# Patient Record
Sex: Male | Born: 1970 | Race: Black or African American | Hispanic: No | Marital: Single | Smoking: Current every day smoker
Health system: Southern US, Community
[De-identification: ages and names within clinical notes are randomized; demographics above are authoritative.]

## PROBLEM LIST (undated history)

## (undated) DIAGNOSIS — J45909 Unspecified asthma, uncomplicated: Secondary | ICD-10-CM

## (undated) DIAGNOSIS — F17200 Nicotine dependence, unspecified, uncomplicated: Secondary | ICD-10-CM

## (undated) DIAGNOSIS — E785 Hyperlipidemia, unspecified: Secondary | ICD-10-CM

## (undated) HISTORY — PX: TONSILLECTOMY: SUR1361

## (undated) HISTORY — DX: Unspecified asthma, uncomplicated: J45.909

## (undated) HISTORY — DX: Hyperlipidemia, unspecified: E78.5

## (undated) HISTORY — DX: Nicotine dependence, unspecified, uncomplicated: F17.200

## (undated) HISTORY — PX: WISDOM TOOTH EXTRACTION: SHX21

---

## 2003-10-31 ENCOUNTER — Emergency Department (HOSPITAL_COMMUNITY): Admission: EM | Admit: 2003-10-31 | Discharge: 2003-10-31 | Payer: Self-pay | Admitting: Emergency Medicine

## 2003-11-01 ENCOUNTER — Ambulatory Visit (HOSPITAL_COMMUNITY): Admission: RE | Admit: 2003-11-01 | Discharge: 2003-11-01 | Payer: Self-pay | Admitting: Emergency Medicine

## 2012-11-26 ENCOUNTER — Telehealth: Payer: Self-pay | Admitting: Family Medicine

## 2012-11-26 NOTE — Telephone Encounter (Signed)
This person would like to be your patient.  His girlfriend is a patient of yours, Janalyn Harder.  Pt has [facility]. Will you accept?

## 2012-11-27 NOTE — Telephone Encounter (Signed)
ok 

## 2012-11-27 NOTE — Telephone Encounter (Signed)
appt set/kh 

## 2013-02-16 ENCOUNTER — Ambulatory Visit: Payer: Self-pay | Admitting: Family Medicine

## 2013-04-14 ENCOUNTER — Ambulatory Visit: Payer: Self-pay | Admitting: Family Medicine

## 2013-05-11 ENCOUNTER — Ambulatory Visit (INDEPENDENT_AMBULATORY_CARE_PROVIDER_SITE_OTHER): Payer: BC Managed Care – PPO | Admitting: Family Medicine

## 2013-05-11 ENCOUNTER — Encounter: Payer: Self-pay | Admitting: Family Medicine

## 2013-05-11 VITALS — BP 110/80 | Temp 98.6°F | Ht 68.7 in | Wt 151.0 lb

## 2013-05-11 DIAGNOSIS — F17200 Nicotine dependence, unspecified, uncomplicated: Secondary | ICD-10-CM

## 2013-05-11 DIAGNOSIS — Z8042 Family history of malignant neoplasm of prostate: Secondary | ICD-10-CM | POA: Insufficient documentation

## 2013-05-11 DIAGNOSIS — Z720 Tobacco use: Secondary | ICD-10-CM | POA: Insufficient documentation

## 2013-05-11 DIAGNOSIS — Z0000 Encounter for general adult medical examination without abnormal findings: Secondary | ICD-10-CM | POA: Insufficient documentation

## 2013-05-11 LAB — POCT URINALYSIS DIPSTICK
Bilirubin, UA: NEGATIVE
Ketones, UA: NEGATIVE
Leukocytes, UA: NEGATIVE
Nitrite, UA: NEGATIVE
Protein, UA: NEGATIVE
pH, UA: 7

## 2013-05-11 MED ORDER — VARENICLINE TARTRATE 1 MG PO TABS: 1.0000 mg | ORAL_TABLET | Freq: Two times a day (BID) | ORAL | Status: DC

## 2013-05-11 NOTE — Patient Instructions (Signed)
Labs today  Chantix 1 mg.......... one half tablet daily for 2 weeks then one half tab twice daily  Taper by 2 cigarettes per week  Return in one month for followup

## 2013-05-11 NOTE — Progress Notes (Signed)
  Subjective:    Patient ID: Eugene Kennedy, male    DOB: [birth year], 42 y.o.   MRN: [id number]  HPI Eugene Kennedy is a 42 year old male smoker 10 cigarettes a day for about 20 years who comes in today as a new patient  He's always been in excellent health he said no chronic health problems and he takes no medication for any chronic disease. We is a child his mother told him he had a rash from penicillin.  As noted above he does smoke about 10 cigarettes a day for 20 years he expresses a desire to stop.  Family history pertinent father was an alcoholic and had prostate cancer.  He works for a Location manager   Review of Systems  Constitutional: Negative.   HENT: Negative.   Eyes: Negative.   Respiratory: Negative.   Cardiovascular: Negative.   Gastrointestinal: Negative.   Genitourinary: Negative.   Musculoskeletal: Negative.   Skin: Negative.   Neurological: Negative.   Psychiatric/Behavioral: Negative.        Objective:   Physical Exam  Constitutional: He is oriented to person, place, and time. He appears well-developed and well-nourished.  HENT:  Head: Normocephalic and atraumatic.  Right Ear: External ear normal.  Left Ear: External ear normal.  Nose: Nose normal.  Mouth/Throat: Oropharynx is clear and moist.  Eyes: Conjunctivae and EOM are normal. Pupils are equal, round, and reactive to light.  Neck: Normal range of motion. Neck supple. No JVD present. No tracheal deviation present. No thyromegaly present.  Cardiovascular: Normal rate, regular rhythm, normal heart sounds and intact distal pulses.  Exam reveals no gallop and no friction rub.   No murmur heard. Pulmonary/Chest: Effort normal and breath sounds normal. No stridor. No respiratory distress. He has no wheezes. He has no rales. He exhibits no tenderness.  Abdominal: Soft. Bowel sounds are normal. He exhibits no distension and no mass. There is no tenderness. There is no rebound and no guarding.  Genitourinary:  Rectum normal, prostate normal and penis normal. Guaiac negative stool. No penile tenderness.  Musculoskeletal: Normal range of motion. He exhibits no edema and no tenderness.  Lymphadenopathy:    He has no cervical adenopathy.  Neurological: He is alert and oriented to person, place, and time. He has normal reflexes. No cranial nerve deficit. He exhibits normal muscle tone.  Skin: Skin is warm and dry. No rash noted. No erythema. No pallor.  Psychiatric: He has a normal mood and affect. His behavior is normal. Judgment and thought content normal.          Assessment & Plan:  Healthy male  Family history prostate cancer check PSA and DRE yearly  Tobacco abuse begin smoking cessation program

## 2013-05-12 LAB — BASIC METABOLIC PANEL
CO2: 27 mEq/L (ref 19–32)
Calcium: 9.6 mg/dL (ref 8.4–10.5)
Chloride: 104 mEq/L (ref 96–112)
Creatinine, Ser: 1.1 mg/dL (ref 0.4–1.5)
Sodium: 137 mEq/L (ref 135–145)

## 2013-05-12 LAB — CBC WITH DIFFERENTIAL/PLATELET
Eosinophils Relative: 4.2 % (ref 0.0–5.0)
HCT: 40.8 % (ref 39.0–52.0)
Lymphs Abs: 2.8 10*3/uL (ref 0.7–4.0)
MCHC: 34 g/dL (ref 30.0–36.0)
MCV: 90.9 fl (ref 78.0–100.0)
Monocytes Absolute: 0.4 10*3/uL (ref 0.1–1.0)
Neutrophils Relative %: 44.7 % (ref 43.0–77.0)
Platelets: 235 10*3/uL (ref 150.0–400.0)
RDW: 14.5 % (ref 11.5–14.6)
WBC: 6.3 10*3/uL (ref 4.5–10.5)

## 2013-05-12 LAB — HEPATIC FUNCTION PANEL
Albumin: 4.5 g/dL (ref 3.5–5.2)
Alkaline Phosphatase: 62 U/L (ref 39–117)
Bilirubin, Direct: 0.1 mg/dL (ref 0.0–0.3)
Total Bilirubin: 0.8 mg/dL (ref 0.3–1.2)

## 2013-05-12 LAB — LIPID PANEL
Cholesterol: 171 mg/dL (ref 0–200)
Total CHOL/HDL Ratio: 3
Triglycerides: 153 mg/dL — ABNORMAL HIGH (ref 0.0–149.0)

## 2013-05-12 LAB — PSA: PSA: 1.13 ng/mL (ref 0.10–4.00)

## 2013-06-18 ENCOUNTER — Ambulatory Visit: Payer: BC Managed Care – PPO | Admitting: Family Medicine

## 2013-11-19 ENCOUNTER — Ambulatory Visit: Payer: BC Managed Care – PPO | Admitting: Family Medicine

## 2016-05-29 ENCOUNTER — Ambulatory Visit (INDEPENDENT_AMBULATORY_CARE_PROVIDER_SITE_OTHER): Payer: 59 | Admitting: Family Medicine

## 2016-05-29 ENCOUNTER — Encounter: Payer: Self-pay | Admitting: Family Medicine

## 2016-05-29 VITALS — BP 120/82 | HR 97 | Temp 98.6°F | Wt 166.0 lb

## 2016-05-29 DIAGNOSIS — R229 Localized swelling, mass and lump, unspecified: Secondary | ICD-10-CM

## 2016-05-29 NOTE — Progress Notes (Signed)
Pre visit review using our clinic review tool, if applicable. No additional management support is needed unless otherwise documented below in the visit note. 

## 2016-05-29 NOTE — Progress Notes (Signed)
Phone: [phone]  Subjective:  Patient presents today to establish care. Chief complaint-noted.   See problem oriented charting  The following were reviewed and entered/updated in epic: Past Medical History:  Diagnosis Date  . Asthma    childhood  . Smoker    Patient Active Problem List   Diagnosis Date Noted  . Tobacco abuse 05/11/2013    Priority: High  . Family history of prostate cancer 05/11/2013    Priority: Low   Past Surgical History:  Procedure Laterality Date  . WISDOM TOOTH EXTRACTION     x1    Family History  Problem Relation Age of Onset  . Prostate cancer Father     age 73. now 27 as of 2017  . Hypertension Father   . Alcohol abuse Father     did other drugs as well  . Healthy Mother   . HIV Brother     died of aids- half brother. 2 half sisters.   . Healthy Daughter     45 years old    Medications- reviewed and updated No current outpatient prescriptions on file.   No current facility-administered medications for this visit.     Allergies-reviewed and updated Allergies  Allergen Reactions  . Penicillins     Social History   Social History  . Marital status: Single    Spouse name: N/A  . Number of children: N/A  . Years of education: N/A   Social History Main Topics  . Smoking status: Current Every Day Smoker    Packs/day: 0.50    Types: Cigarettes    Start date: 07/16/1990  . Smokeless tobacco: None  . Alcohol use Yes     Comment: 5 drinks a week  . Drug use: No  . Sexual activity: Not Asked   Other Topics Concern  . None   Social History Narrative   Single. 1 daughter age 45, lives in [place]- lives in 2017.       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       Hobbies: music    ROS--Full ROS was completed Review of Systems  Constitutional: Negative for chills and fever.  HENT: Negative for hearing loss and tinnitus.    Eyes: Negative for blurred vision and double vision.  Respiratory: Negative for cough and hemoptysis.   Cardiovascular: Negative for chest pain and palpitations.  Gastrointestinal: Negative for heartburn and nausea.  Genitourinary: Negative for dysuria and urgency.  Musculoskeletal: Negative for myalgias and neck pain.  Skin: Negative for itching and rash.  Neurological: Negative for dizziness and headaches.  Endo/Heme/Allergies: Negative for polydipsia. Does not bruise/bleed easily.    Objective: BP 120/82 (BP Location: Left Arm, Patient Position: Sitting, Cuff Size: Large)   Pulse 97   Temp 98.6 F (37 C) (Oral)   Wt 166 lb (75.3 kg)   SpO2 99%   BMI 24.73 kg/m  Gen: NAD, resting comfortably HEENT: Mucous membranes are moist. Oropharynx normal. TM normal. Eyes: sclera and lids normal, PERRLA Neck: no cervical lymphadenopathy CV: RRR no murmurs rubs or gallops Lungs: CTAB no crackles, wheeze, rhonchi Abdomen: soft/nontender/nondistended/normal bowel sounds. No rebound or guarding.  Ext: no edema Skin: warm, dry On left occiput- has a < 1 x 1 cm mobile subcutaneous nodule/potential cyst Neuro: 5/5 strength in upper and lower extremities, normal gait, normal reflexes  Assessment/Plan:  Subcutaneous nodule S: felt like a  knot or ingrown hair in back of left scalp. Growing slightly and has become more painful. When lays on it- some pain when lays on scalp. Sometimes in bed can make it hard to sleep or even wake up. Present for over a month perhaps up to 6 weeks.   Somewhat similar lesion on his thumb though that is near joint space and may be ganglion cyst- present over a year and painful with palpation.  A/P: This may be a cyst but with pain and texture- I also wonder about isolated neurofibroma. Patient would like to have removed and discussed thought this would be best done by dermatology so referral provided   Orders Placed This Encounter  Procedures  . Ambulatory  referral to Dermatology    Referral Priority:   Routine    Referral Type:   Consultation    Referral Reason:   Specialty Services Required    Requested Specialty:   Dermatology    Number of Visits Requested:   1   Return precautions advised.  Garret Reddish, MD

## 2016-05-29 NOTE — Patient Instructions (Signed)
We will call you within a week about your referral to dermatology. If you do not hear within 2 weeks, give [place] a call.   I may actually see you before you have the dermatology visit but when you do go in- want you to get your hair cut as short as possible which will assist with the removal. Shaving would be the most ideal.

## 2016-07-02 ENCOUNTER — Ambulatory Visit (INDEPENDENT_AMBULATORY_CARE_PROVIDER_SITE_OTHER): Payer: 59 | Admitting: Family Medicine

## 2016-07-02 ENCOUNTER — Encounter: Payer: Self-pay | Admitting: Family Medicine

## 2016-07-02 ENCOUNTER — Other Ambulatory Visit (HOSPITAL_COMMUNITY)
Admission: RE | Admit: 2016-07-02 | Discharge: 2016-07-02 | Disposition: A | Discharge status: Home / Self Care | Payer: 59 | Source: Ambulatory Visit | Attending: Family Medicine | Admitting: Family Medicine

## 2016-07-02 VITALS — BP 126/82 | HR 89 | Temp 98.2°F | Ht 68.0 in | Wt 162.0 lb

## 2016-07-02 DIAGNOSIS — Z113 Encounter for screening for infections with a predominantly sexual mode of transmission: Secondary | ICD-10-CM | POA: Insufficient documentation

## 2016-07-02 DIAGNOSIS — Z7251 High risk heterosexual behavior: Secondary | ICD-10-CM | POA: Diagnosis not present

## 2016-07-02 DIAGNOSIS — Z0000 Encounter for general adult medical examination without abnormal findings: Secondary | ICD-10-CM | POA: Diagnosis not present

## 2016-07-02 LAB — COMPREHENSIVE METABOLIC PANEL
ALBUMIN: 4.8 g/dL (ref 3.5–5.2)
ALT: 33 U/L (ref 0–53)
AST: 16 U/L (ref 0–37)
Alkaline Phosphatase: 63 U/L (ref 39–117)
BILIRUBIN TOTAL: 0.6 mg/dL (ref 0.2–1.2)
BUN: 12 mg/dL (ref 6–23)
CALCIUM: 9.6 mg/dL (ref 8.4–10.5)
CHLORIDE: 103 meq/L (ref 96–112)
CO2: 27 meq/L (ref 19–32)
Creatinine, Ser: 0.96 mg/dL (ref 0.40–1.50)
GFR: 108.88 mL/min (ref >60.00)
Glucose, Bld: 89 mg/dL (ref 70–99)
Potassium: 4.5 mEq/L (ref 3.5–5.1)
Sodium: 138 mEq/L (ref 135–145)
Total Protein: 7.4 g/dL (ref 6.0–8.3)

## 2016-07-02 LAB — CBC
HCT: 41 % (ref 39.0–52.0)
HEMOGLOBIN: 14.1 g/dL (ref 13.0–17.0)
MCHC: 34.3 g/dL (ref 30.0–36.0)
MCV: 91.4 fl (ref 78.0–100.0)
PLATELETS: 252 10*3/uL (ref 150.0–400.0)
RBC: 4.48 Mil/uL (ref 4.22–5.81)
RDW: 14.3 % (ref 11.5–15.5)
WBC: 6.1 10*3/uL (ref 4.0–10.5)

## 2016-07-02 LAB — LIPID PANEL
CHOLESTEROL: 206 mg/dL — AB (ref 0–200)
HDL: 48.7 mg/dL (ref >39.00)
LDL CALC: 137 mg/dL — AB (ref 0–99)
NonHDL: 157.43
TRIGLYCERIDES: 100 mg/dL (ref 0.0–149.0)
Total CHOL/HDL Ratio: 4
VLDL: 20 mg/dL (ref 0.0–40.0)

## 2016-07-02 LAB — PSA: PSA: 1.96 ng/mL (ref 0.10–4.00)

## 2016-07-02 NOTE — Patient Instructions (Signed)
Schedule a lab visit at the check out desk within 2 weeks. Return for future fasting labs meaning nothing but water after midnight please. Ok to take your medications with water.    

## 2016-07-02 NOTE — Addendum Note (Signed)
Addended by: Elmer Picker on: 07/02/2016 03:01 PM   Modules accepted: Orders

## 2016-07-02 NOTE — Assessment & Plan Note (Signed)
has tried gum in past didn't help, e cigarette didn't help. Cold [place] makes him very angry. GF also smokes- advised complete cessation. 5 cigarettes a day. Goal down to 3 a day by follow up 6 months.

## 2016-07-02 NOTE — Progress Notes (Signed)
Phone: [phone]  Subjective:  Patient presents today for their annual physical. Chief complaint-noted.   See problem oriented charting- ROS- full  review of systems was completed and negative including No chest pain or shortness of breath (except if goes up stairs right after a cigarette). No headache or blurry vision.   The following were reviewed and entered/updated in epic: Past Medical History:  Diagnosis Date  . Asthma    childhood  . Smoker    Patient Active Problem List   Diagnosis Date Noted  . Tobacco abuse 05/11/2013    Priority: High  . Family history of prostate cancer 05/11/2013    Priority: Low   Past Surgical History:  Procedure Laterality Date  . WISDOM TOOTH EXTRACTION     x1    Family History  Problem Relation Age of Onset  . Prostate cancer Father     age 36. now 26 as of 2017  . Hypertension Father   . Alcohol abuse Father     did other drugs as well  . Healthy Mother   . HIV Brother     died of aids- half brother. 2 half sisters.   . Healthy Daughter     61 years old    Medications- reviewed and updated No current outpatient prescriptions on file.   No current facility-administered medications for this visit.     Allergies-reviewed and updated Allergies  Allergen Reactions  . Penicillins     Social History   Social History  . Marital status: Single    Spouse name: N/A  . Number of children: N/A  . Years of education: N/A   Social History Main Topics  . Smoking status: Current Every Day Smoker    Packs/day: 0.50    Types: Cigarettes    Start date: 07/16/1990  . Smokeless tobacco: None  . Alcohol use Yes     Comment: 5 drinks a week  . Drug use: No  . Sexual activity: Not Asked   Other Topics Concern  . None   Social History Narrative   Single. 1 daughter age 4, lives in [place]- lives in [place].       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       Hobbies: music    Objective: BP 126/82 (BP Location: Left Arm, Patient Position: Sitting, Cuff Size: Normal)   Pulse 89   Temp 98.2 F (36.8 C) (Oral)   Ht 5\' 8"  (1.727 m)   Wt 162 lb (73.5 kg)   SpO2 97%   BMI 24.63 kg/m  Gen: NAD, resting comfortably HEENT: Mucous membranes are moist. Oropharynx normal Neck: no thyromegaly CV: RRR no murmurs rubs or gallops Lungs: CTAB no crackles, wheeze, rhonchi Abdomen: soft/nontender/nondistended/normal bowel sounds. No rebound or guarding.  Ext: no edema Skin: warm, dry Neuro: grossly normal, moves all extremities, PERRLA Rectal: normal tone, diffusely enlarged prostate, no masses or tenderness  Assessment/Plan:  45 y.o. male presenting for annual physical.  Health Maintenance counseling: 1. Anticipatory guidance: Patient counseled regarding regular dental exams- just seen last week(also seeing dentist for his gums), eye exams- no issues, wearing seatbelts.  2. Risk factor reduction:  Advised patient of need for regular exercise (off and on- membership once a week if lucky- generally recommend 150 minutes a week) and diet rich and fruits and vegetables to reduce risk of heart attack and stroke. Diet  reasonable- fruit for breakfast, nuts for snacks, fair amount of veggies, red meat twice a week (maybe keep more than once a week). Been eating out more- needs to cook at home more.  3. Immunizations/screenings/ancillary studies Immunization History  Administered Date(s) Administered  . Td 05/11/2008   Health Maintenance Due  Topic Date Due  . HIV Screening - STD screening today 05/26/1986   4. Prostate cancer screening- opts in- rectal today, psa with labs . BPH on exam and nocturia 1-2x a night.  Lab Results  Component Value Date   PSA 1.13 05/11/2013   5. Colon cancer screening - no family history, start at age 76 6. STD screening- opts in  7. Testicular cancer screening- advised monthly self  exams  Status of chronic or acute concerns  Sees dermatology in February for likely cyst removal on scalp  Tobacco abuse has tried gum in past didn't help, e cigarette didn't help. Cold [place] makes him very angry. GF also smokes- advised complete cessation. 5 cigarettes a day. Goal down to 3 a day by follow up 6 months.   Wants to check in 6 months- would discuss smoking and weight  Orders Placed This Encounter  Procedures  . CBC    Standing Status:   Future    Standing Expiration Date:   07/02/2017  . Comprehensive metabolic panel    [place]    Standing Status:   Future    Standing Expiration Date:   07/02/2017  . HIV antibody    Standing Status:   Future    Standing Expiration Date:   07/02/2017  . PSA    Standing Status:   Future    Standing Expiration Date:   07/02/2017  . Lipid panel    Standing Status:   Future    Standing Expiration Date:   07/02/2017  . RPR    [facility]    Standing Status:   Future    Standing Expiration Date:   07/02/2017   Return precautions advised.   Garret Reddish, MD

## 2016-07-02 NOTE — Progress Notes (Signed)
Pre visit review using our clinic review tool, if applicable. No additional management support is needed unless otherwise documented below in the visit note. 

## 2016-07-03 LAB — RPR

## 2016-07-03 LAB — URINE CYTOLOGY ANCILLARY ONLY
Chlamydia: NEGATIVE
Neisseria Gonorrhea: NEGATIVE
Trichomonas: NEGATIVE

## 2016-07-03 LAB — HIV ANTIBODY (ROUTINE TESTING W REFLEX): HIV: NONREACTIVE

## 2016-09-06 DIAGNOSIS — L720 Epidermal cyst: Secondary | ICD-10-CM | POA: Diagnosis not present

## 2016-12-31 ENCOUNTER — Encounter: Payer: Self-pay | Admitting: Family Medicine

## 2016-12-31 ENCOUNTER — Ambulatory Visit (INDEPENDENT_AMBULATORY_CARE_PROVIDER_SITE_OTHER): Payer: 59 | Admitting: Family Medicine

## 2016-12-31 VITALS — BP 110/84 | HR 97 | Temp 98.8°F | Ht 68.0 in | Wt 165.6 lb

## 2016-12-31 DIAGNOSIS — Z720 Tobacco use: Secondary | ICD-10-CM | POA: Diagnosis not present

## 2016-12-31 DIAGNOSIS — E663 Overweight: Secondary | ICD-10-CM

## 2016-12-31 NOTE — Assessment & Plan Note (Signed)
S: last visit was 5 cigarettes a day and goal was to be down to 3 a day. Cold [place] makes him too angry per hsi report. E cigs and gums have not helped. GF smokes making it harder.   Stable at cigarettes 5 a day today A/P: has not cut back as intended. Encouraged him to get down to the 3 by 6 month physical- he agrees. We also discussed Wellbutrin option

## 2016-12-31 NOTE — Progress Notes (Signed)
Subjective:  Eugene Kennedy is a 46 y.o. year old very pleasant male patient who presents for/with See problem oriented charting ROS- No chest pain or shortness of breath. No headache or blurry vision.    Past Medical History-  Patient Active Problem List   Diagnosis Date Noted  . Tobacco abuse 05/11/2013    Priority: High  . Family history of prostate cancer 05/11/2013    Priority: Low    Medications- reviewed and updated No current outpatient prescriptions on file.   No current facility-administered medications for this visit.     Objective: BP 110/84 (BP Location: Left Arm, Patient Position: Sitting, Cuff Size: Large)   Pulse 97   Temp 98.8 F (37.1 C) (Oral)   Ht 5\' 8"  (1.727 m)   Wt 165 lb 9.6 oz (75.1 kg)   SpO2 97%   BMI 25.18 kg/m  Gen: NAD, resting comfortably Mucous membranes are moist. CV: RRR no murmurs rubs or gallops Lungs: CTAB no crackles, wheeze, rhonchi  Ext: no edema Skin: warm, dry  Assessment/Plan:  Overweight S: last visit we discussed cutting down of neating out.   Started back at [place] yesterday- working on reducing carb intake. Had been in [place] and mother fed him very very well and weight slipped up.   Considering walking with GF.  Wt Readings from Last 3 Encounters:  12/31/16 165 lb 9.6 oz (75.1 kg)  07/02/16 162 lb (73.5 kg)  05/29/16 166 lb (75.3 kg)  A/P:Goal to get weight back into 150. Advised to start exercise- like his plan of increasing plants/veggies through farmers market shopping  Tobacco abuse S: last visit was 5 cigarettes a day and goal was to be down to 3 a day. Cold [place] makes him too angry per hsi report. E cigs and gums have not helped. GF smokes making it harder.   Stable at cigarettes 5 a day today A/P: has not cut back as intended. Encouraged him to get down to the 3 by 6 month physical- he agrees. We also discussed Wellbutrin option   Return in about 6 months (around 07/02/2017) for physical. January  would be very reasonable. Marland Kitchen He likely will delay until January  The duration of face-to-face time during this visit was greater than 15 minutes. Greater than 50% of this time was spent in counseling, explanation of diagnosis, planning of further management, and/or coordination of care including discussion of long term risks of smoking, discussion reducing smoking, discussion of weight, healthy habits.   Return precautions advised.  Garret Reddish, MD

## 2016-12-31 NOTE — Patient Instructions (Signed)
Lets try to get weight back under 160  Lets work down to 3 cigarettes max per day by physical  ______________________________________________________________________  Starting October 1st 2018, I will be transferring to our new location: [place] (corner of [address] and [address] from [employer]) [place], [place] Phone: [phone]  I would love to have you remain my patient at this new location as long as it remains convenient for you. I am excited about the opportunity to have x-ray and sports medicine in the new building but will really miss the awesome staff and physicians at [facility]. Continue to schedule appointments at [facility] and we will automatically transfer them to the [place] location starting October 1st.

## 2017-07-22 ENCOUNTER — Encounter: Payer: 59 | Admitting: Family Medicine

## 2017-08-09 ENCOUNTER — Encounter: Payer: Self-pay | Admitting: Family Medicine

## 2017-08-09 ENCOUNTER — Ambulatory Visit (INDEPENDENT_AMBULATORY_CARE_PROVIDER_SITE_OTHER): Payer: 59 | Admitting: Family Medicine

## 2017-08-09 VITALS — BP 102/82 | HR 90 | Temp 98.8°F | Ht 68.75 in | Wt 163.0 lb

## 2017-08-09 DIAGNOSIS — E785 Hyperlipidemia, unspecified: Secondary | ICD-10-CM | POA: Diagnosis not present

## 2017-08-09 DIAGNOSIS — R351 Nocturia: Secondary | ICD-10-CM

## 2017-08-09 DIAGNOSIS — N401 Enlarged prostate with lower urinary tract symptoms: Secondary | ICD-10-CM | POA: Diagnosis not present

## 2017-08-09 DIAGNOSIS — Z720 Tobacco use: Secondary | ICD-10-CM | POA: Diagnosis not present

## 2017-08-09 DIAGNOSIS — Z0000 Encounter for general adult medical examination without abnormal findings: Secondary | ICD-10-CM

## 2017-08-09 NOTE — Progress Notes (Signed)
Phone: [phone]  Subjective:  Patient presents today for their annual physical. Chief complaint-noted.   See problem oriented charting- ROS- full  review of systems was completed and negative except for: bald spot left side of his head  The following were reviewed and entered/updated in epic: Past Medical History:  Diagnosis Date  . Asthma    childhood  . Smoker    Patient Active Problem List   Diagnosis Date Noted  . Tobacco abuse 05/11/2013    Priority: High  . Family history of prostate cancer 05/11/2013    Priority: Low   Past Surgical History:  Procedure Laterality Date  . WISDOM TOOTH EXTRACTION     x1    Family History  Problem Relation Age of Onset  . Prostate cancer Father        age 58. now 29 as of 2017  . Hypertension Father   . Alcohol abuse Father        did other drugs as well  . Healthy Mother   . HIV Brother        died of aids- half brother. 2 half sisters.   . Healthy Daughter        10 years old    Medications- reviewed and updated No current outpatient medications on file.   No current facility-administered medications for this visit.     Allergies-reviewed and updated Allergies  Allergen Reactions  . Penicillins     Social History   Socioeconomic History  . Marital status: Single    Spouse name: None  . Number of children: None  . Years of education: None  . Highest education level: None  Social Needs  . Financial resource strain: None  . Food insecurity - worry: None  . Food insecurity - inability: None  . Transportation needs - medical: None  . Transportation needs - non-medical: None  Occupational History  . None  Tobacco Use  . Smoking status: Current Every Day Smoker    Packs/day: 0.50    Types: Cigarettes    Start date: 07/16/1990  . Smokeless tobacco: Never Used  Substance and Sexual Activity  . Alcohol use: Yes    Comment: 5 drinks a week  . Drug use: No  . Sexual activity: None  Other Topics Concern    . None  Social History Narrative   Single. 1 daughter age 67, lives in [place]- lives in 2017.    Engaged July 20th, 2019 - will be married.    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       Hobbies: music, DJ- weddings, birthdays, bars    Objective: BP 102/82 (BP Location: Left Arm, Patient Position: Sitting, Cuff Size: Large)   Pulse 90   Temp 98.8 F (37.1 C) (Oral)   Ht 5' 8.75" (1.746 m)   Wt 163 lb (73.9 kg)   SpO2 97%   BMI 24.25 kg/m  Gen: NAD, resting comfortably HEENT: Mucous membranes are moist. Oropharynx normal Neck: no thyromegaly CV: RRR no murmurs rubs or gallops Lungs: CTAB no crackles, wheeze, rhonchi Abdomen: soft/nontender/nondistended/normal bowel sounds. No rebound or guarding.  Ext: no edema Skin: warm, dry Neuro: grossly normal, moves all extremities, PERRLA Rectal: normal tone, diffusely enlarged prostate, no masses or tenderness  Assessment/Plan:  47 y.o. male presenting for annual physical.  Health Maintenance  counseling: 1. Anticipatory guidance: Patient counseled regarding regular dental exams -q6 months, eye exams -no issues, wearing seatbelts.  2. Risk factor reduction:  Advised patient of need for regular exercise and diet rich and fruits and vegetables to reduce risk of heart attack and stroke. Had actually lost 5 lbs- then regained 3 since last visit. Doing gym 3 days a week. Discussed current weight ok he would like to be closer to 150 or so.  Wt Readings from Last 3 Encounters:  08/09/17 163 lb (73.9 kg)  12/31/16 165 lb 9.6 oz (75.1 kg)  07/02/16 162 lb (73.5 kg)  3. Immunizations/screenings/ancillary studies- declines flu shot.  Immunization History  Administered Date(s) Administered  . Td 05/11/2008  4. Prostate cancer screening- family history of prostate cancer- will update PSA . BPH with nocturia still  about once a night  Lab Results  Component Value Date   PSA 1.96 07/02/2016   PSA 1.13 05/11/2013   5. Colon cancer screening - no family history, start at age 20 6. Testicular cancer screening- advised monthly self exams - no issues 8. STD screening- patient opts out- monogamous at this point and getting married 9. Current smoker- goal last visit was to get down to 3 a day cigarettes by today. He is dow to 1 pack per week- so he is at goal. Nonah Mattes makes him want to smoke less. Encouraged complete cessation  Status of chronic or acute concerns  Mild hyperlipidemia- update lipids today. 4 years ago lipids were normal.   One bald spot on left side of scalp seems to come and go. Denies injury to area. On exam- skin looks completley normal underneath. Discussed possible referral to Dr. Leonie Green in [facility] but he states not very bothersome- will just watch and let me know if worsens.   Also years ago had scalp in cyst- that went away on its own. May have been related to ingrown hair  Return in about 1 year (around 08/09/2018) for physical.  Lab/Order associations: Preventative health care - Plan: PSA, Lipid panel, CBC, Comprehensive metabolic panel, POCT Urinalysis Dipstick (Automated)  Hyperlipidemia, unspecified hyperlipidemia type - Plan: Lipid panel, CBC, Comprehensive metabolic panel, POCT Urinalysis Dipstick (Automated)  BPH associated with nocturia - Plan: PSA  Return precautions advised.  Garret Reddish, MD

## 2017-08-09 NOTE — Assessment & Plan Note (Signed)
goal last visit was to get down to 3 a day cigarettes by today. He is dow to 1 pack per week- so he is at goal. Nonah Mattes makes him want to smoke less. Encouraged complete cessation. GF smokes and he is hesitant about completely quitting.

## 2017-08-09 NOTE — Patient Instructions (Addendum)
Schedule a lab visit at the check out desk within 2-3 weeks. Return for future fasting labs meaning nothing but water after midnight please. Ok to take your medications with water.   We could also check in 6 months from now to see how you are doing from the smoking and weight perspective  You had very mild high cholesterol previously- hopefully this is improving with exercise and weight loss. Instead of starting cholesterol medicine- I want you to focus on continued exercise, healthy eating, quitting smoking- these can lower your risk for heart disease

## 2017-08-21 ENCOUNTER — Other Ambulatory Visit (INDEPENDENT_AMBULATORY_CARE_PROVIDER_SITE_OTHER): Payer: 59

## 2017-08-21 DIAGNOSIS — R351 Nocturia: Secondary | ICD-10-CM

## 2017-08-21 DIAGNOSIS — N401 Enlarged prostate with lower urinary tract symptoms: Secondary | ICD-10-CM | POA: Diagnosis not present

## 2017-08-21 DIAGNOSIS — Z0000 Encounter for general adult medical examination without abnormal findings: Secondary | ICD-10-CM | POA: Diagnosis not present

## 2017-08-21 DIAGNOSIS — E785 Hyperlipidemia, unspecified: Secondary | ICD-10-CM | POA: Diagnosis not present

## 2017-08-21 LAB — COMPREHENSIVE METABOLIC PANEL
ALK PHOS: 58 U/L (ref 39–117)
ALT: 51 U/L (ref 0–53)
AST: 26 U/L (ref 0–37)
Albumin: 4.7 g/dL (ref 3.5–5.2)
BILIRUBIN TOTAL: 0.6 mg/dL (ref 0.2–1.2)
BUN: 14 mg/dL (ref 6–23)
CO2: 28 meq/L (ref 19–32)
Calcium: 9.6 mg/dL (ref 8.4–10.5)
Chloride: 103 mEq/L (ref 96–112)
Creatinine, Ser: 1.03 mg/dL (ref 0.40–1.50)
GFR: 99.88 mL/min (ref >60.00)
GLUCOSE: 96 mg/dL (ref 70–99)
Potassium: 4.7 mEq/L (ref 3.5–5.1)
Sodium: 138 mEq/L (ref 135–145)
TOTAL PROTEIN: 7.6 g/dL (ref 6.0–8.3)

## 2017-08-21 LAB — CBC
HCT: 43.9 % (ref 39.0–52.0)
HEMOGLOBIN: 14.6 g/dL (ref 13.0–17.0)
MCHC: 33.3 g/dL (ref 30.0–36.0)
MCV: 94.6 fl (ref 78.0–100.0)
PLATELETS: 258 10*3/uL (ref 150.0–400.0)
RBC: 4.64 Mil/uL (ref 4.22–5.81)
RDW: 14.4 % (ref 11.5–15.5)
WBC: 5.1 10*3/uL (ref 4.0–10.5)

## 2017-08-21 LAB — POC URINALSYSI DIPSTICK (AUTOMATED)
Bilirubin, UA: NEGATIVE
Blood, UA: NEGATIVE
Glucose, UA: NEGATIVE
Ketones, UA: NEGATIVE
LEUKOCYTES UA: NEGATIVE
NITRITE UA: NEGATIVE
PH UA: 6 (ref 5.0–8.0)
PROTEIN UA: NEGATIVE
Spec Grav, UA: 1.025 (ref 1.010–1.025)
Urobilinogen, UA: 0.2 E.U./dL

## 2017-08-21 LAB — LIPID PANEL
CHOL/HDL RATIO: 5
Cholesterol: 202 mg/dL — ABNORMAL HIGH (ref 0–200)
HDL: 43.5 mg/dL (ref >39.00)
LDL Cholesterol: 138 mg/dL — ABNORMAL HIGH (ref 0–99)
NonHDL: 158.24
TRIGLYCERIDES: 99 mg/dL (ref 0.0–149.0)
VLDL: 19.8 mg/dL (ref 0.0–40.0)

## 2017-08-21 LAB — PSA: PSA: 1.61 ng/mL (ref 0.10–4.00)

## 2018-02-18 ENCOUNTER — Ambulatory Visit: Payer: 59 | Admitting: Family Medicine

## 2018-05-22 ENCOUNTER — Encounter: Payer: Self-pay | Admitting: Family Medicine

## 2018-05-22 ENCOUNTER — Ambulatory Visit: Payer: 59 | Admitting: Family Medicine

## 2018-05-22 VITALS — BP 119/79 | HR 90 | Temp 98.4°F | Ht 68.75 in | Wt 162.6 lb

## 2018-05-22 DIAGNOSIS — E785 Hyperlipidemia, unspecified: Secondary | ICD-10-CM | POA: Diagnosis not present

## 2018-05-22 DIAGNOSIS — Z23 Encounter for immunization: Secondary | ICD-10-CM | POA: Diagnosis not present

## 2018-05-22 DIAGNOSIS — Z720 Tobacco use: Secondary | ICD-10-CM | POA: Diagnosis not present

## 2018-05-22 NOTE — Assessment & Plan Note (Signed)
S: Mild poorly controlled on no medication.  Mild elevated ten-year risk of heart attack or stroke at 6.8%  Patient has lost 1 pound from January-remains in normal weight range  Lab Results  Component Value Date   CHOL 202 (H) 08/21/2017   HDL 43.50 08/21/2017   LDLCALC 138 (H) 08/21/2017   TRIG 99.0 08/21/2017   CHOLHDL 5 08/21/2017   A/P: patient states he wants to remain off chemicals/meds. We discussed best way to do that is to get off the chemical/med of cigarettes and how that will help him remain off cholesterol medicine- for instance how this would lower his 10 year risk

## 2018-05-22 NOTE — Assessment & Plan Note (Signed)
S: Last visit patient was down to 1 pack/week of cigarettes.  He feels that being at the gym helps him smoke less.  We encouraged complete cessation last visit  He is about a pack a day. Wife smoking half pack per day. He is more enthusiastic about quitting than she is.   He states doesn't believe in patches/gum - doesn't want to use prescribed chemicals.  A/P: encouraged cessation- he is not quite ready but wants to continue to cut down on cigarettes. I challenged him to lead his family in this as his wife also smokes- I believe he can make a huge impact on both he and his wife's lives

## 2018-05-22 NOTE — Progress Notes (Signed)
Subjective:  Eugene Kennedy is a 47 y.o. year old very pleasant male patient who presents for/with See problem oriented charting ROS- no reported chest pain, shortness of breath, headache, blurry vision- reports overall feels well   Past Medical History-  Patient Active Problem List   Diagnosis Date Noted  . Tobacco abuse 05/11/2013    Priority: High  . Hyperlipidemia, unspecified 05/22/2018    Priority: Medium  . Family history of prostate cancer 05/11/2013    Priority: Low    Medications- reviewed and updated No current outpatient medications on file.   No current facility-administered medications for this visit.     Objective: BP 119/79 (BP Location: Left Arm, Patient Position: Sitting, Cuff Size: Large)   Pulse 90   Temp 98.4 F (36.9 C) (Oral)   Ht 5' 8.75" (1.746 m)   Wt 162 lb 9.6 oz (73.8 kg)   SpO2 98%   BMI 24.19 kg/m  Gen: NAD, resting comfortably CV: RRR no murmurs rubs or gallops Lungs: CTAB no crackles, wheeze, rhonchi Abdomen: soft/nontender/nondistended/normal bowel sounds. Ext: no edema Skin: warm, dry  Assessment/Plan:  Hyperlipidemia, unspecified S: Mild poorly controlled on no medication.  Mild elevated ten-year risk of heart attack or stroke at 6.8%  Patient has lost 1 pound from January-remains in normal weight range  Lab Results  Component Value Date   CHOL 202 (H) 08/21/2017   HDL 43.50 08/21/2017   LDLCALC 138 (H) 08/21/2017   TRIG 99.0 08/21/2017   CHOLHDL 5 08/21/2017   A/P: patient states he wants to remain off chemicals/meds. We discussed best way to do that is to get off the chemical/med of cigarettes and how that will help him remain off cholesterol medicine- for instance how this would lower his 10 year risk  Tobacco abuse S: Last visit patient was down to 1 pack/week of cigarettes.  He feels that being at the gym helps him smoke less.  We encouraged complete cessation last visit  He is about a pack a day. Wife smoking half pack  per day. He is more enthusiastic about quitting than she is.   He states doesn't believe in patches/gum - doesn't want to use prescribed chemicals.  A/P: encouraged cessation- he is not quite ready but wants to continue to cut down on cigarettes. I challenged him to lead his family in this as his wife also smokes- I believe he can make a huge impact on both he and his wife's lives  Patient planning to transfer to [facility] as closer to home.  Lab/Order associations: Need for prophylactic vaccination with combined diphtheria-tetanus-pertussis (DTP) vaccine - Plan: Tdap vaccine greater than or equal to 7yo IM  Hyperlipidemia, unspecified hyperlipidemia type  Tobacco abuse  Time Stamp The duration of face-to-face time during this visit was greater than 15 minutes. Greater than 50% of this time was spent in counseling, explanation of diagnosis, planning of further management, and/or coordination of care including discussing how quitting smoking can lower his other risks, discussing moderating risk factors like lipids with healthy lifestyle and regular exercise.    Return precautions advised.  Garret Reddish, MD

## 2018-05-22 NOTE — Patient Instructions (Signed)
Congratulations on your marriage!  Great job on keeping the overall cigarette burden low.  I think you can now make that next step of quitting completely.  You have achieved many a great thing in your life and I think this is the next great thing you will achieve.  I think you can also lead your wife and this and help her quit smoking as well as she sees your positive example  The other benefit of quitting smoking would be reducing your overall risk of heart attack and stroke which would allow [place] to keep you off cholesterol medication at the present time.  Other things that can help with this are continued efforts for healthy eating and regular exercise.  We will miss you here at [facility] but we are glad you have found a location close to you that can serve you well.

## 2018-05-23 ENCOUNTER — Telehealth: Payer: Self-pay | Admitting: Family Medicine

## 2018-05-23 NOTE — Telephone Encounter (Signed)
TOC was approved via in basket message.

## 2018-07-08 NOTE — Telephone Encounter (Signed)
Pt scheduled with Mackie Pai 07/29/18

## 2018-07-29 ENCOUNTER — Ambulatory Visit: Payer: 59 | Admitting: Medical

## 2018-07-29 ENCOUNTER — Ambulatory Visit (HOSPITAL_BASED_OUTPATIENT_CLINIC_OR_DEPARTMENT_OTHER)
Admission: RE | Admit: 2018-07-29 | Discharge: 2018-07-29 | Disposition: A | Discharge status: Home / Self Care | Payer: 59 | Source: Ambulatory Visit | Attending: Medical | Admitting: Medical

## 2018-07-29 ENCOUNTER — Encounter: Payer: Self-pay | Admitting: Medical

## 2018-07-29 VITALS — BP 120/78 | HR 86 | Temp 98.4°F | Resp 16 | Ht 68.75 in | Wt 163.0 lb

## 2018-07-29 DIAGNOSIS — R059 Cough, unspecified: Secondary | ICD-10-CM

## 2018-07-29 DIAGNOSIS — R05 Cough: Secondary | ICD-10-CM | POA: Insufficient documentation

## 2018-07-29 DIAGNOSIS — E785 Hyperlipidemia, unspecified: Secondary | ICD-10-CM

## 2018-07-29 DIAGNOSIS — Z720 Tobacco use: Secondary | ICD-10-CM | POA: Insufficient documentation

## 2018-07-29 DIAGNOSIS — Z1211 Encounter for screening for malignant neoplasm of colon: Secondary | ICD-10-CM

## 2018-07-29 LAB — COMPREHENSIVE METABOLIC PANEL
ALBUMIN: 4.8 g/dL (ref 3.5–5.2)
ALK PHOS: 65 U/L (ref 39–117)
ALT: 79 U/L — ABNORMAL HIGH (ref 0–53)
AST: 63 U/L — ABNORMAL HIGH (ref 0–37)
BUN: 14 mg/dL (ref 6–23)
CALCIUM: 9.9 mg/dL (ref 8.4–10.5)
CO2: 27 mEq/L (ref 19–32)
CREATININE: 1.04 mg/dL (ref 0.40–1.50)
Chloride: 103 mEq/L (ref 96–112)
GFR: 98.37 mL/min (ref >60.00)
Glucose, Bld: 90 mg/dL (ref 70–99)
POTASSIUM: 4.8 meq/L (ref 3.5–5.1)
SODIUM: 139 meq/L (ref 135–145)
TOTAL PROTEIN: 7.7 g/dL (ref 6.0–8.3)
Total Bilirubin: 0.4 mg/dL (ref 0.2–1.2)

## 2018-07-29 LAB — LIPID PANEL
CHOL/HDL RATIO: 4
CHOLESTEROL: 213 mg/dL — AB (ref 0–200)
HDL: 50.8 mg/dL (ref >39.00)
LDL CALC: 141 mg/dL — AB (ref 0–99)
NonHDL: 162.04
TRIGLYCERIDES: 107 mg/dL (ref 0.0–149.0)
VLDL: 21.4 mg/dL (ref 0.0–40.0)

## 2018-07-29 NOTE — Progress Notes (Signed)
Subjective:    Patient ID: Eugene Kennedy, male    DOB: February 20, 1971, 48 y.o.   MRN: [id number]  HPI  Pt in for first time.  He lives and works very close to here. So he decided to switch practices.   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 Pt diet is ok. Pt does smoke. Pt drinks alcohol on weekends.   Pt has hx of high cholesterol. Lipid high in February.    Asthma as child. No flare as adult.  No family history of colon cancer of known polyps.(pt thinks he needs screening colonoscopy).  Pt The 10-year ASCVD risk score Mikey Bussing DC Jr., et al., 2013) is: 8.4%   Values used to calculate the score:     Age: 66 years     Sex: Male     Is Non-Hispanic African American: Yes     Diabetic: No     Tobacco smoker: Yes     Systolic Blood Pressure: 086 mmHg     Is BP treated: No     HDL Cholesterol: 43.5 mg/dL     Total Cholesterol: 202 mg/dL    Review of Systems  Constitutional: Negative for chills, fatigue and fever.  HENT: Negative for congestion, drooling, ear discharge, ear pain, facial swelling, sinus pressure and sneezing.   Respiratory: Positive for cough. Negative for chest tightness, shortness of breath and wheezing.        Occasional cough. Smoker.  Cardiovascular: Negative for chest pain and palpitations.  Gastrointestinal: Negative for abdominal pain, constipation, nausea and vomiting.  Genitourinary: Negative for difficulty urinating, enuresis, flank pain and frequency.  Musculoskeletal: Negative for back pain and neck pain.  Skin: Negative for rash.  Neurological: Negative for dizziness, seizures, weakness, numbness and headaches.  Hematological: Negative for adenopathy. Does not bruise/bleed easily.  Psychiatric/Behavioral: Negative for agitation, behavioral problems, decreased concentration, sleep disturbance and suicidal ideas. The patient is not nervous/anxious.     Past Medical History:  Diagnosis Date  . Asthma    childhood  . Smoker        Social History   Socioeconomic History  . Marital status: Single    Spouse name: Not on file  . Number of children: Not on file  . Years of education: Not on file  . Highest education level: Not on file  Occupational History  . Not on file  Social Needs  . Financial resource strain: Not on file  . Food insecurity:    Worry: Not on file    Inability: Not on file  . Transportation needs:    Medical: Not on file    Non-medical: Not on file  Tobacco Use  . Smoking status: Current Every Day Smoker    Packs/day: 0.50    Types: Cigarettes    Start date: 07/16/1990  . Smokeless tobacco: Never Used  Substance and Sexual Activity  . Alcohol use: Yes    Comment: 5 drinks a week  . Drug use: No  . Sexual activity: Not on file  Lifestyle  . Physical activity:    Days per week: Not on file    Minutes per session: Not on file  . Stress: Not on file  Relationships  . Social connections:    Talks on phone: Not on file    Gets together: Not on file    Attends religious service: Not on file    Active member of club or organization: Not on file    Attends meetings  of clubs or organizations: Not on file    Relationship status: Not on file  . Intimate partner violence:    Fear of current or ex partner: Not on file    Emotionally abused: Not on file    Physically abused: Not on file    Forced sexual activity: Not on file  Other Topics Concern  . Not on file  Social History Narrative   Married 02/01/2018.    From prior relationship- 1 daughter age 6, lives in [place]- lives in 2017.    Engaged July 20th, 2019 - will be married.    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       Hobbies: music, DJ- weddings, birthdays, bars    Past Surgical History:  Procedure Laterality Date  . WISDOM TOOTH EXTRACTION     x1    Family History  Problem Relation  Age of Onset  . Prostate cancer Father        age 13. now 73 as of 2017  . Hypertension Father   . Alcohol abuse Father        did other drugs as well  . Healthy Mother   . HIV Brother        died of aids- half brother. 2 half sisters.   . Healthy Daughter        63 years old    Allergies  Allergen Reactions  . Penicillins     No current outpatient medications on file prior to visit.   No current facility-administered medications on file prior to visit.     BP (!) 130/92   Pulse 86   Temp 98.4 F (36.9 C) (Oral)   Resp 16   Ht 5' 8.75" (1.746 m)   Wt 163 lb (73.9 kg)   SpO2 100%   BMI 24.25 kg/m       Objective:   Physical Exam  General Mental Status- Alert. General Appearance- Not in acute distress.   Skin General: Color- Normal Color. Moisture- Normal Moisture.  Neck Carotid Arteries- Normal color. Moisture- Normal Moisture. No carotid bruits. No JVD.  Chest and Lung Exam Auscultation: Breath Sounds:-Normal.  Cardiovascular Auscultation:Rythm- Regular. Murmurs & Other Heart Sounds:Auscultation of the heart reveals- No Murmurs.  Abdomen Inspection:-Inspeection Normal. Palpation/Percussion:Note:No mass. Palpation and Percussion of the abdomen reveal- Non Tender, Non Distended + BS, no rebound or guarding.   Neurologic Cranial Nerve exam:- CN III-XII intact(No nystagmus), symmetric smile. Strength:- 5/5 equal and symmetric strength both upper and lower extremities.      Assessment & Plan:  For high cholesterol will get lipid panel and metabolic panel today.  For smoking please stop. Discussed risk of continuing. Chest xray today since you have occasional cough. Can offer meds for assistance if needed.  Placed screening colonoscopy.  Continue to exercise.  Cut back on alcohol use.  Follow up date to be determined after lab and xray review.  25 minutes spent with pt. New pt to our practice but within [facility] system. 50% of time spent  counseling on importance of stopping smoking and benefit of statin with his medical history.  Mackie Pai, PA-C

## 2018-07-29 NOTE — Patient Instructions (Signed)
For high cholesterol will get lipid panel and metabolic panel today.  For smoking please stop. Discussed risk of continuing. Chest xray today since you have occasional cough. Can offer meds for assistance if needed.  Placed screening colonoscopy.  Continue to exercise.  Cut back on alcohol use.  Follow up date to be determined after lab and xray review.

## 2018-08-29 ENCOUNTER — Encounter: Payer: Self-pay | Admitting: Medical

## 2019-11-19 ENCOUNTER — Encounter: Payer: Self-pay | Admitting: Medical

## 2019-11-19 ENCOUNTER — Telehealth: Payer: Self-pay | Admitting: Medical

## 2019-11-19 ENCOUNTER — Other Ambulatory Visit: Payer: Self-pay

## 2019-11-19 ENCOUNTER — Ambulatory Visit (INDEPENDENT_AMBULATORY_CARE_PROVIDER_SITE_OTHER): Payer: PRIVATE HEALTH INSURANCE | Admitting: Medical

## 2019-11-19 VITALS — BP 124/81 | HR 82 | Resp 18 | Ht 67.0 in | Wt 159.0 lb

## 2019-11-19 DIAGNOSIS — R05 Cough: Secondary | ICD-10-CM

## 2019-11-19 DIAGNOSIS — Z0000 Encounter for general adult medical examination without abnormal findings: Secondary | ICD-10-CM | POA: Diagnosis not present

## 2019-11-19 DIAGNOSIS — F17200 Nicotine dependence, unspecified, uncomplicated: Secondary | ICD-10-CM

## 2019-11-19 DIAGNOSIS — Z1211 Encounter for screening for malignant neoplasm of colon: Secondary | ICD-10-CM | POA: Diagnosis not present

## 2019-11-19 DIAGNOSIS — R059 Cough, unspecified: Secondary | ICD-10-CM

## 2019-11-19 DIAGNOSIS — Z125 Encounter for screening for malignant neoplasm of prostate: Secondary | ICD-10-CM | POA: Diagnosis not present

## 2019-11-19 DIAGNOSIS — E875 Hyperkalemia: Secondary | ICD-10-CM

## 2019-11-19 DIAGNOSIS — Z113 Encounter for screening for infections with a predominantly sexual mode of transmission: Secondary | ICD-10-CM

## 2019-11-19 LAB — LIPID PANEL
Cholesterol: 200 mg/dL (ref 0–200)
HDL: 48.2 mg/dL (ref >39.00)
LDL Cholesterol: 133 mg/dL — ABNORMAL HIGH (ref 0–99)
NonHDL: 151.86
Total CHOL/HDL Ratio: 4
Triglycerides: 95 mg/dL (ref 0.0–149.0)
VLDL: 19 mg/dL (ref 0.0–40.0)

## 2019-11-19 LAB — CBC WITH DIFFERENTIAL/PLATELET
Basophils Absolute: 0 10*3/uL (ref 0.0–0.1)
Basophils Relative: 0.6 % (ref 0.0–3.0)
Eosinophils Absolute: 0.2 10*3/uL (ref 0.0–0.7)
Eosinophils Relative: 3 % (ref 0.0–5.0)
HCT: 44.1 % (ref 39.0–52.0)
Hemoglobin: 14.7 g/dL (ref 13.0–17.0)
Lymphocytes Relative: 44.1 % (ref 12.0–46.0)
Lymphs Abs: 2.6 10*3/uL (ref 0.7–4.0)
MCHC: 33.3 g/dL (ref 30.0–36.0)
MCV: 95.2 fl (ref 78.0–100.0)
Monocytes Absolute: 0.6 10*3/uL (ref 0.1–1.0)
Monocytes Relative: 9.4 % (ref 3.0–12.0)
Neutro Abs: 2.6 10*3/uL (ref 1.4–7.7)
Neutrophils Relative %: 42.9 % — ABNORMAL LOW (ref 43.0–77.0)
Platelets: 207 10*3/uL (ref 150.0–400.0)
RBC: 4.63 Mil/uL (ref 4.22–5.81)
RDW: 14.3 % (ref 11.5–15.5)
WBC: 6 10*3/uL (ref 4.0–10.5)

## 2019-11-19 LAB — COMPREHENSIVE METABOLIC PANEL
ALT: 41 U/L (ref 0–53)
AST: 23 U/L (ref 0–37)
Albumin: 5 g/dL (ref 3.5–5.2)
Alkaline Phosphatase: 65 U/L (ref 39–117)
BUN: 8 mg/dL (ref 6–23)
CO2: 31 mEq/L (ref 19–32)
Calcium: 10.4 mg/dL (ref 8.4–10.5)
Chloride: 103 mEq/L (ref 96–112)
Creatinine, Ser: 0.93 mg/dL (ref 0.40–1.50)
GFR: 104.72 mL/min (ref >60.00)
Glucose, Bld: 90 mg/dL (ref 70–99)
Potassium: 5.4 mEq/L — ABNORMAL HIGH (ref 3.5–5.1)
Sodium: 138 mEq/L (ref 135–145)
Total Bilirubin: 0.4 mg/dL (ref 0.2–1.2)
Total Protein: 8 g/dL (ref 6.0–8.3)

## 2019-11-19 LAB — PSA: PSA: 1.91 ng/mL (ref 0.10–4.00)

## 2019-11-19 NOTE — Progress Notes (Signed)
Subjective:    Patient ID: Eugene Kennedy, male    DOB: 12-08-1970, 49 y.o.   MRN: [id number]  HPI  Pt in for cpe/wellness.   He lives and works very close to here. So he decided to switch practices.   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 Pt diet is ok. Pt does smoke. Pt drinks alcohol on weekends.   No family history of colon cancer of known polyps.(pt thinks he needscreening colonoscopy).  Pt used to be occasional smoker. About 5 cigarettes a day past 3 years.. Smoking since teenager. Former half pack a day at most over the years. Occasional cough during the day.    Review of Systems  Constitutional: Negative for chills, fatigue and fever.  HENT: Negative for congestion, drooling and ear pain.   Respiratory: Positive for cough. Negative for chest tightness, shortness of breath and wheezing.   Cardiovascular: Negative for chest pain and palpitations.  Gastrointestinal: Negative for abdominal pain, diarrhea, nausea and vomiting.  Genitourinary: Negative for dysuria, flank pain and frequency.  Musculoskeletal: Negative for back pain, gait problem and neck pain.  Skin: Negative for rash.  Neurological: Negative for dizziness, numbness and headaches.  Hematological: Negative for adenopathy. Does not bruise/bleed easily.  Psychiatric/Behavioral: Negative for confusion, dysphoric mood and hallucinations. The patient is not nervous/anxious.     Past Medical History:  Diagnosis Date  . Asthma    childhood  . Hyperlipidemia   . Smoker      Social History   Socioeconomic History  . Marital status: Single    Spouse name: Not on file  . Number of children: Not on file  . Years of education: Not on file  . Highest education level: Not on file  Occupational History  . Not on file  Tobacco Use  . Smoking status: Current Every Day Smoker    Packs/day: 0.50    Types: Cigarettes    Start date: 07/16/1990  . Smokeless tobacco: Never Used  Substance  and Sexual Activity  . Alcohol use: Yes    Comment: 5 drinks a week  . Drug use: No  . Sexual activity: Yes  Other Topics Concern  . Not on file  Social History Narrative   Married 02/01/2018.    From prior relationship- 1 daughter age 65, lives in [place]- lives in 2017.    Engaged July 20th, 2019 - will be married.    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       Hobbies: music, DJ- weddings, birthdays, bars   Social Determinants of Radio broadcast assistant Strain:   . Difficulty of Paying Living Expenses:   Food Insecurity:   . Worried About Charity fundraiser in the Last Year:   . Arboriculturist in the Last Year:   Transportation Needs:   . Film/video editor (Medical):   Marland Kitchen Lack of Transportation (Non-Medical):   Physical Activity:   . Days of Exercise per Week:   . Minutes of Exercise per Session:   Stress:   . Feeling of Stress :   Social Connections:   . Frequency of Communication with Friends and Family:   . Frequency of Social Gatherings with Friends and Family:   . Attends Religious Services:   . Active Member of Clubs  or Organizations:   . Attends Archivist Meetings:   Marland Kitchen Marital Status:   Intimate Partner Violence:   . Fear of Current or Ex-Partner:   . Emotionally Abused:   Marland Kitchen Physically Abused:   . Sexually Abused:     Past Surgical History:  Procedure Laterality Date  . WISDOM TOOTH EXTRACTION     x1    Family History  Problem Relation Age of Onset  . Prostate cancer Father        age 27. now 7 as of 2017  . Hypertension Father   . Alcohol abuse Father        did other drugs as well  . Healthy Mother   . HIV Brother        died of aids- half brother. 2 half sisters.   . Healthy Daughter        17 years old    Allergies  Allergen Reactions  . Penicillins     No current outpatient  medications on file prior to visit.   No current facility-administered medications on file prior to visit.    BP 124/81 (BP Location: Left Arm, Patient Position: Sitting, Cuff Size: Large)   Pulse 82   Resp 18   Ht 5\' 7"  (1.702 m)   Wt 159 lb (72.1 kg)   SpO2 96%   BMI 24.90 kg/m       Objective:   Physical Exam  General Mental Status- Alert. General Appearance- Not in acute distress.   Skin General: Color- Normal Color. Moisture- Normal Moisture.  Neck Carotid Arteries- Normal color. Moisture- Normal Moisture. No carotid bruits. No JVD.  Chest and Lung Exam Auscultation: Breath Sounds:-Normal.  Cardiovascular Auscultation:Rythm- Regular. Murmurs & Other Heart Sounds:Auscultation of the heart reveals- No Murmurs.  Abdomen Inspection:-Inspeection Normal. Palpation/Percussion:Note:No mass. Palpation and Percussion of the abdomen reveal- Non Tender, Non Distended + BS, no rebound or guarding.    Neurologic Cranial Nerve exam:- CN III-XII intact(No nystagmus), symmetric smile. Strength:- 5/5 equal and symmetric strength both upper and lower extremities.  Rectal- tight sphincter. Difficult exam. Portion of prostate felt normal size and smooth.     Assessment & Plan:  For you wellness exam today I have ordered cbc, cmp, lipid panel,psa and hiv.  Vaccine appears up to date.  Recommend exercise and healthy diet.  We will let you know lab results as they come in.  Follow up date appointment will be determined after lab review.   cxr due to smoking hx and occasional cough after smoking.  Please try to stop smoking. If you ever want to try medication to help stop let me know,  Screening colonoscopy referral placed.   Mackie Pai, PA-C

## 2019-11-19 NOTE — Patient Instructions (Addendum)
For you wellness exam today I have ordered cbc, cmp, lipid panel,psa and hiv.  Vaccine appears up to date.  Recommend exercise and healthy diet.  We will let you know lab results as they come in.  Follow up date appointment will be determined after lab review.   cxr due to smoking hx and occasional cough after smoking.  Please try to stop smoking. If you ever want to try medication to help stop let me know,  Screening colonoscopy referral placed.   Preventive Care 82-49 Years Old, Male Preventive care refers to lifestyle choices and visits with your health care provider that can promote health and wellness. This includes:  A yearly physical exam. This is also called an annual well check.  Regular dental and eye exams.  Immunizations.  Screening for certain conditions.  Healthy lifestyle choices, such as eating a healthy diet, getting regular exercise, not using drugs or products that contain nicotine and tobacco, and limiting alcohol use. What can I expect for my preventive care visit? Physical exam Your health care provider will check:  Height and weight. These may be used to calculate body mass index (BMI), which is a measurement that tells if you are at a healthy weight.  Heart rate and blood pressure.  Your skin for abnormal spots. Counseling Your health care provider may ask you questions about:  Alcohol, tobacco, and drug use.  Emotional well-being.  Home and relationship well-being.  Sexual activity.  Eating habits.  Work and work Statistician. What immunizations do I need?  Influenza (flu) vaccine  This is recommended every year. Tetanus, diphtheria, and pertussis (Tdap) vaccine  You may need a Td booster every 10 years. Varicella (chickenpox) vaccine  You may need this vaccine if you have not already been vaccinated. Zoster (shingles) vaccine  You may need this after age 1. Measles, mumps, and rubella (MMR) vaccine  You may need at least  one dose of MMR if you were born in 1957 or later. You may also need a second dose. Pneumococcal conjugate (PCV13) vaccine  You may need this if you have certain conditions and were not previously vaccinated. Pneumococcal polysaccharide (PPSV23) vaccine  You may need one or two doses if you smoke cigarettes or if you have certain conditions. Meningococcal conjugate (MenACWY) vaccine  You may need this if you have certain conditions. Hepatitis A vaccine  You may need this if you have certain conditions or if you travel or work in places where you may be exposed to hepatitis A. Hepatitis B vaccine  You may need this if you have certain conditions or if you travel or work in places where you may be exposed to hepatitis B. Haemophilus influenzae type b (Hib) vaccine  You may need this if you have certain risk factors. Human papillomavirus (HPV) vaccine  If recommended by your health care provider, you may need three doses over 6 months. You may receive vaccines as individual doses or as more than one vaccine together in one shot (combination vaccines). Talk with your health care provider about the risks and benefits of combination vaccines. What tests do I need? Blood tests  Lipid and cholesterol levels. These may be checked every 5 years, or more frequently if you are over 49 years old.  Hepatitis C test.  Hepatitis B test. Screening  Lung cancer screening. You may have this screening every year starting at age 49 if you have a 30-pack-year history of smoking and currently smoke or have quit within the  past 15 years.  Prostate cancer screening. Recommendations will vary depending on your family history and other risks.  Colorectal cancer screening. All adults should have this screening starting at age 49 and continuing until age 49. Your health care provider may recommend screening at age 49 if you are at increased risk. You will have tests every 1-10 years, depending on your  results and the type of screening test.  Diabetes screening. This is done by checking your blood sugar (glucose) after you have not eaten for a while (fasting). You may have this done every 1-3 years.  Sexually transmitted disease (STD) testing. Follow these instructions at home: Eating and drinking  Eat a diet that includes fresh fruits and vegetables, whole grains, lean protein, and low-fat dairy products.  Take vitamin and mineral supplements as recommended by your health care provider.  Do not drink alcohol if your health care provider tells you not to drink.  If you drink alcohol: ? Limit how much you have to 0-2 drinks a day. ? Be aware of how much alcohol is in your drink. In the [place] one drink equals one 12 oz bottle of beer (355 mL), one 5 oz glass of wine (148 mL), or one 1 oz glass of hard liquor (44 mL). Lifestyle  Take daily care of your teeth and gums.  Stay active. Exercise for at least 30 minutes on 5 or more days each week.  Do not use any products that contain nicotine or tobacco, such as cigarettes, e-cigarettes, and chewing tobacco. If you need help quitting, ask your health care provider.  If you are sexually active, practice safe sex. Use a condom or other form of protection to prevent STIs (sexually transmitted infections).  Talk with your health care provider about taking a low-dose aspirin every day starting at age 49. What's next?  Go to your health care provider once a year for a well check visit.  Ask your health care provider how often you should have your eyes and teeth checked.  Stay up to date on all vaccines. This information is not intended to replace advice given to you by your health care provider. Make sure you discuss any questions you have with your health care provider. Document Revised: 06/26/2018 Document Reviewed: 06/26/2018 Elsevier Patient Education  2020 [employer].

## 2019-11-19 NOTE — Telephone Encounter (Signed)
Future cmp placed. Please get him scheduled for early next week.

## 2019-11-20 LAB — HIV ANTIBODY (ROUTINE TESTING W REFLEX): HIV 1&2 Ab, 4th Generation: NONREACTIVE

## 2019-11-20 NOTE — Telephone Encounter (Signed)
Called pt and lvm to return call.  

## 2019-11-23 ENCOUNTER — Telehealth: Payer: Self-pay | Admitting: Medical

## 2019-11-23 NOTE — Telephone Encounter (Signed)
Caller Mihran Leiba  Call Back # [phone]  Patient is requesting a call back . Patient states that CMA called.

## 2019-11-23 NOTE — Telephone Encounter (Signed)
Called pt lvm to return call

## 2019-11-24 ENCOUNTER — Other Ambulatory Visit: Payer: Self-pay

## 2019-11-24 ENCOUNTER — Other Ambulatory Visit (INDEPENDENT_AMBULATORY_CARE_PROVIDER_SITE_OTHER): Payer: PRIVATE HEALTH INSURANCE

## 2019-11-24 DIAGNOSIS — E875 Hyperkalemia: Secondary | ICD-10-CM

## 2019-11-24 LAB — COMPREHENSIVE METABOLIC PANEL
ALT: 45 U/L (ref 0–53)
AST: 25 U/L (ref 0–37)
Albumin: 4.8 g/dL (ref 3.5–5.2)
Alkaline Phosphatase: 66 U/L (ref 39–117)
BUN: 9 mg/dL (ref 6–23)
CO2: 29 mEq/L (ref 19–32)
Calcium: 9.9 mg/dL (ref 8.4–10.5)
Chloride: 102 mEq/L (ref 96–112)
Creatinine, Ser: 1 mg/dL (ref 0.40–1.50)
GFR: 96.3 mL/min (ref >60.00)
Glucose, Bld: 113 mg/dL — ABNORMAL HIGH (ref 70–99)
Potassium: 5 mEq/L (ref 3.5–5.1)
Sodium: 136 mEq/L (ref 135–145)
Total Bilirubin: 0.5 mg/dL (ref 0.2–1.2)
Total Protein: 7.7 g/dL (ref 6.0–8.3)

## 2019-11-26 ENCOUNTER — Other Ambulatory Visit: Payer: Self-pay

## 2019-11-26 ENCOUNTER — Ambulatory Visit (HOSPITAL_BASED_OUTPATIENT_CLINIC_OR_DEPARTMENT_OTHER)
Admission: RE | Admit: 2019-11-26 | Discharge: 2019-11-26 | Disposition: A | Discharge status: Home / Self Care | Payer: PRIVATE HEALTH INSURANCE | Source: Ambulatory Visit | Attending: Medical | Admitting: Medical

## 2019-11-26 DIAGNOSIS — R059 Cough, unspecified: Secondary | ICD-10-CM

## 2019-11-26 DIAGNOSIS — F17200 Nicotine dependence, unspecified, uncomplicated: Secondary | ICD-10-CM

## 2019-11-26 DIAGNOSIS — R05 Cough: Secondary | ICD-10-CM | POA: Insufficient documentation

## 2020-01-21 ENCOUNTER — Encounter: Payer: Self-pay | Admitting: Medical

## 2020-05-20 ENCOUNTER — Ambulatory Visit: Payer: Self-pay | Admitting: Medical

## 2020-07-19 ENCOUNTER — Ambulatory Visit: Payer: PRIVATE HEALTH INSURANCE | Admitting: Medical

## 2020-07-19 ENCOUNTER — Encounter: Payer: Self-pay | Admitting: Medical

## 2020-07-19 ENCOUNTER — Other Ambulatory Visit: Payer: Self-pay

## 2020-07-19 VITALS — BP 115/76 | HR 76 | Temp 98.7°F | Resp 12 | Ht 67.0 in | Wt 162.6 lb

## 2020-07-19 DIAGNOSIS — R739 Hyperglycemia, unspecified: Secondary | ICD-10-CM | POA: Diagnosis not present

## 2020-07-19 DIAGNOSIS — F17200 Nicotine dependence, unspecified, uncomplicated: Secondary | ICD-10-CM | POA: Diagnosis not present

## 2020-07-19 DIAGNOSIS — E785 Hyperlipidemia, unspecified: Secondary | ICD-10-CM | POA: Diagnosis not present

## 2020-07-19 NOTE — Patient Instructions (Addendum)
You have history of mild elevated sugar level. Recommend low sugar diet and exercise regularly.  For mild high cholesterol recommend low cholesterol diet and regular exercise.   Do recommend stopping smoking. You state will do without meds. If you feel ned medication to help can offer Wellbutrin.  Also recommend cutting back on alcohol use as you stated would do.   You mentioned would get covid booster upcoming week or so. Good idea.  Follow up May for wellness exam and fasting labs. Sooner if needed.

## 2020-07-19 NOTE — Progress Notes (Signed)
Subjective:    Patient ID: Eugene Kennedy, male    DOB: 1970/08/23, 50 y.o.   MRN: [id number]  HPI  Pt in for follow up.  Pt update me his insurance won't pay for colonoscopy until after he turns 50 years old.  Pt was mild elevated in the past.  Pt has mild elevated ldl cholesterol.   Pt does not want any blood work today. He wants to wait on blood work until May.  Pt has been smoking about 4-5 cigarettes a day. Pt wants to quite smoking. He declines any medications. Declines wellbutrin.   Pt states plans to cut back on drinking. Will plan to drink only on Friday and Saturday.       Review of Systems  Constitutional: Negative for chills, fatigue and fever.  HENT: Negative for congestion, drooling, ear pain and mouth sores.   Respiratory: Negative for cough, chest tightness, shortness of breath and wheezing.   Cardiovascular: Negative for chest pain and palpitations.  Gastrointestinal: Negative for abdominal pain.  Genitourinary: Negative for dysuria, flank pain and frequency.  Musculoskeletal: Negative for back pain and myalgias.  Skin: Negative for rash.  Neurological: Negative for dizziness, seizures, syncope, weakness and headaches.  Hematological: Negative for adenopathy. Does not bruise/bleed easily.  Psychiatric/Behavioral: Negative for behavioral problems, confusion and dysphoric mood. The patient is not nervous/anxious.     Past Medical History:  Diagnosis Date  . Asthma    childhood  . Hyperlipidemia   . Smoker      Social History   Socioeconomic History  . Marital status: Single    Spouse name: Not on file  . Number of children: Not on file  . Years of education: Not on file  . Highest education level: Not on file  Occupational History  . Not on file  Tobacco Use  . Smoking status: Current Every Day Smoker    Packs/day: 0.50    Types: Cigarettes    Start date: 07/16/1990  . Smokeless tobacco: Never Used  Substance and Sexual Activity  . Alcohol  use: Yes    Comment: 5 drinks a week  . Drug use: No  . Sexual activity: Yes  Other Topics Concern  . Not on file  Social History Narrative   Married 02/01/2018.    From prior relationship- 1 daughter age 62, lives in [place]- lives in 2017.    Engaged July 20th, 2019 - will be married.    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       Hobbies: music, DJ- weddings, birthdays, bars   Social Determinants of Radio broadcast assistant Strain: Not on [employer] Insecurity: Not on file  Transportation Needs: Not on file  Physical Activity: Not on file  Stress: Not on file  Social Connections: Not on file  Intimate Partner Violence: Not on file    Past Surgical History:  Procedure Laterality Date  . WISDOM TOOTH EXTRACTION     x1    Family History  Problem Relation Age of Onset  . Prostate cancer Father        age 42. now 44 as of 2017  . Hypertension Father   . Alcohol abuse Father        did other drugs as well  . Healthy Mother   . HIV Brother  died of aids- half brother. 2 half sisters.   . Healthy Daughter        3 years old    Allergies  Allergen Reactions  . Penicillins     No current outpatient medications on file prior to visit.   No current facility-administered medications on file prior to visit.    BP 115/76 (BP Location: Left Arm, Cuff Size: Large)   Pulse 76   Temp 98.7 F (37.1 C) (Oral)   Resp 12   Ht 5\' 7"  (1.702 m)   Wt 162 lb 9.6 oz (73.8 kg)   SpO2 100%   BMI 25.47 kg/m       Objective:   Physical Exam  General Mental Status- Alert. General Appearance- Not in acute distress.   Skin General: Color- Normal Color. Moisture- Normal Moisture.  Neck Carotid Arteries- Normal color. Moisture- Normal Moisture. No carotid bruits. No JVD.  Chest and Lung Exam Auscultation: Breath  Sounds:-Normal.  Cardiovascular Auscultation:Rythm- Regular. Murmurs & Other Heart Sounds:Auscultation of the heart reveals- No Murmurs.  Abdomen Inspection:-Inspeection Normal. Palpation/Percussion:Note:No mass. Palpation and Percussion of the abdomen reveal- Non Tender, Non Distended + BS, no rebound or guarding.  Neurologic Cranial Nerve exam:- CN III-XII intact(No nystagmus), symmetric smile. Strength:- 5/5 equal and symmetric strength both upper and lower extremities.      Assessment & Plan:  You have history of mild elevated sugar level. Recommend low sugar diet and exercise regularly.  For mild high cholesterol recommend low cholesterol diet and regular exercise.   Do recommend stopping smoking. You state will do without meds. If you feel ned medication to help can offer Wellbutrin.  Also recommend cutting back on alcohol use as you stated would do.   You mentioned would get covid booster upcoming week or so. Good idea.  Follow up May for wellness exam and fasting labs. Sooner if needed.  June, PA-C   Time spent with patient today was30   minutes which consisted of chart review, discussing diagnoses, work up treatment and documentation.

## 2020-12-19 ENCOUNTER — Other Ambulatory Visit: Payer: Self-pay

## 2020-12-20 ENCOUNTER — Encounter: Payer: PRIVATE HEALTH INSURANCE | Admitting: Medical

## 2020-12-27 ENCOUNTER — Ambulatory Visit (INDEPENDENT_AMBULATORY_CARE_PROVIDER_SITE_OTHER): Payer: PRIVATE HEALTH INSURANCE | Admitting: Medical

## 2020-12-27 ENCOUNTER — Other Ambulatory Visit: Payer: Self-pay

## 2020-12-27 VITALS — BP 117/88 | HR 83 | Temp 98.2°F | Resp 20 | Ht 67.0 in | Wt 160.0 lb

## 2020-12-27 DIAGNOSIS — R059 Cough, unspecified: Secondary | ICD-10-CM

## 2020-12-27 DIAGNOSIS — Z0000 Encounter for general adult medical examination without abnormal findings: Secondary | ICD-10-CM

## 2020-12-27 DIAGNOSIS — F17200 Nicotine dependence, unspecified, uncomplicated: Secondary | ICD-10-CM

## 2020-12-27 DIAGNOSIS — Z125 Encounter for screening for malignant neoplasm of prostate: Secondary | ICD-10-CM | POA: Diagnosis not present

## 2020-12-27 LAB — COMPREHENSIVE METABOLIC PANEL
ALT: 42 U/L (ref 0–53)
AST: 23 U/L (ref 0–37)
Albumin: 4.9 g/dL (ref 3.5–5.2)
Alkaline Phosphatase: 61 U/L (ref 39–117)
BUN: 16 mg/dL (ref 6–23)
CO2: 28 mEq/L (ref 19–32)
Calcium: 9.7 mg/dL (ref 8.4–10.5)
Chloride: 103 mEq/L (ref 96–112)
Creatinine, Ser: 1.17 mg/dL (ref 0.40–1.50)
GFR: 73.16 mL/min (ref >60.00)
Glucose, Bld: 88 mg/dL (ref 70–99)
Potassium: 4.7 mEq/L (ref 3.5–5.1)
Sodium: 138 mEq/L (ref 135–145)
Total Bilirubin: 0.3 mg/dL (ref 0.2–1.2)
Total Protein: 7.7 g/dL (ref 6.0–8.3)

## 2020-12-27 LAB — LIPID PANEL
Cholesterol: 207 mg/dL — ABNORMAL HIGH (ref 0–200)
HDL: 43.2 mg/dL (ref >39.00)
LDL Cholesterol: 133 mg/dL — ABNORMAL HIGH (ref 0–99)
NonHDL: 163.85
Total CHOL/HDL Ratio: 5
Triglycerides: 155 mg/dL — ABNORMAL HIGH (ref 0.0–149.0)
VLDL: 31 mg/dL (ref 0.0–40.0)

## 2020-12-27 LAB — CBC WITH DIFFERENTIAL/PLATELET
Basophils Absolute: 0 10*3/uL (ref 0.0–0.1)
Basophils Relative: 0.5 % (ref 0.0–3.0)
Eosinophils Absolute: 0.2 10*3/uL (ref 0.0–0.7)
Eosinophils Relative: 3 % (ref 0.0–5.0)
HCT: 43.2 % (ref 39.0–52.0)
Hemoglobin: 14.3 g/dL (ref 13.0–17.0)
Lymphocytes Relative: 44.7 % (ref 12.0–46.0)
Lymphs Abs: 2.7 10*3/uL (ref 0.7–4.0)
MCHC: 33.2 g/dL (ref 30.0–36.0)
MCV: 94.3 fl (ref 78.0–100.0)
Monocytes Absolute: 0.4 10*3/uL (ref 0.1–1.0)
Monocytes Relative: 6.4 % (ref 3.0–12.0)
Neutro Abs: 2.8 10*3/uL (ref 1.4–7.7)
Neutrophils Relative %: 45.4 % (ref 43.0–77.0)
Platelets: 240 10*3/uL (ref 150.0–400.0)
RBC: 4.58 Mil/uL (ref 4.22–5.81)
RDW: 14.3 % (ref 11.5–15.5)
WBC: 6.1 10*3/uL (ref 4.0–10.5)

## 2020-12-27 LAB — PSA: PSA: 1.85 ng/mL (ref 0.10–4.00)

## 2020-12-27 NOTE — Progress Notes (Signed)
Subjective:    Patient ID: Eugene Kennedy, male    DOB: 1971/06/30, 50 y.o.   MRN: [id number]  HPI  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 Pt diet is ok. Pt does smoke. Pt drinks alcohol on weekends.   No family history of colon cancer of known polyps.(pt thinks he need screening colonoscopy). Pt has checked with his insurance and they don't cover colonoscopy until he turns 50 years old.   Pt used to be occasional smoker. About 2 cigarettes a day past year.. Smoking since teenager. Former half pack a day at most over the years. Occasional cough during the day.   Rare occasional cough.  Pt declines pneumonia vaccine.  Review of Systems  Constitutional:  Negative for chills, fatigue and fever.  HENT:  Negative for congestion, ear discharge, ear pain, sinus pressure and sinus pain.   Respiratory:  Negative for cough, chest tightness, shortness of breath and wheezing.   Cardiovascular:  Negative for chest pain and palpitations.  Gastrointestinal:  Negative for abdominal pain, diarrhea and nausea.  Endocrine: Negative for polydipsia, polyphagia and polyuria.  Musculoskeletal:  Negative for back pain, joint swelling and neck stiffness.  Skin:  Negative for rash.  Neurological:  Negative for facial asymmetry and headaches.  Hematological:  Negative for adenopathy. Does not bruise/bleed easily.  Psychiatric/Behavioral:  Negative for behavioral problems, confusion and sleep disturbance. The patient is not nervous/anxious.    Past Medical History:  Diagnosis Date   Asthma    childhood   Hyperlipidemia    Smoker      Social History   Socioeconomic History   Marital status: Single    Spouse name: Not on file   Number of children: Not on file   Years of education: Not on file   Highest education level: Not on file  Occupational History   Not on file  Tobacco Use   Smoking status: Every Day    Packs/day: 0.50    Pack years: 0.00    Types: Cigarettes     Start date: 07/16/1990   Smokeless tobacco: Never  Substance and Sexual Activity   Alcohol use: Yes    Comment: 5 drinks a week   Drug use: No   Sexual activity: Yes  Other Topics Concern   Not on file  Social History Narrative   Married 02/01/2018.    From prior relationship- 1 daughter age 46, lives in [place]- lives in 2017.    Engaged July 20th, 2019 - will be married.    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       Hobbies: music, DJ- weddings, birthdays, bars   Social Determinants of Radio broadcast assistant Strain: Not on [employer] Insecurity: Not on file  Transportation Needs: Not on file  Physical Activity: Not on file  Stress: Not on file  Social Connections: Not on file  Intimate Partner Violence: Not on file    Past Surgical History:  Procedure Laterality Date   WISDOM TOOTH EXTRACTION     x1    Family History  Problem Relation Age of Onset   Prostate cancer Father        age 83. now 36 as of 2017   Hypertension Father    Alcohol abuse Father  did other drugs as well   Healthy Mother    HIV Brother        died of aids- half brother. 2 half sisters.    Healthy Daughter        22 years old    Allergies  Allergen Reactions   Penicillins     No current outpatient medications on file prior to visit.   No current facility-administered medications on file prior to visit.    BP 117/88   Pulse 83   Temp 98.2 F (36.8 C)   Resp 20   Ht 5\' 7"  (1.702 m)   Wt 160 lb (72.6 kg)   SpO2 100%   BMI 25.06 kg/m         Objective:   Physical Exam  General Mental Status- Alert. General Appearance- Not in acute distress.   Skin General: Color- Normal Color. Moisture- Normal Moisture.  Neck Carotid Arteries- Normal color. Moisture- Normal Moisture. No carotid bruits. No JVD.  Chest and Lung  Exam Auscultation: Breath Sounds:-Normal.  Cardiovascular Auscultation:Rythm- Regular. Murmurs & Other Heart Sounds:Auscultation of the heart reveals- No Murmurs.  Abdomen Inspection:-Inspeection Normal. Palpation/Percussion:Note:No mass. Palpation and Percussion of the abdomen reveal- Non Tender, Non Distended + BS, no rebound or guarding.   Neurologic Cranial Nerve exam:- CN III-XII intact(No nystagmus), symmetric smile. Drift Test:- No drift. Romberg Exam:- Negative.  Heal to Toe Gait exam:-Normal. Finger to Nose:- Normal/Intact Strength:- 5/5 equal and symmetric strength both upper and lower extremities.       Assessment & Plan:  For you wellness exam today I have ordered cbc, cmp and  lipid panel.   Pneumonia vaccine declined.  Recommend exercise and healthy diet.  We will let you know lab results as they come in.  Follow up date appointment will be determined after lab review.    Chest xray order for cough and hx of smoking.  I put in ct chest screening due to history of smoking. May not qualify but due to pt concern did place.   Mackie Pai, PA-C

## 2020-12-27 NOTE — Patient Instructions (Addendum)
For you wellness exam today I have ordered cbc, cmp and  lipid panel.   Pneumonia vaccine declined.  Recommend exercise and healthy diet.  We will let you know lab results as they come in.  Follow up date appointment will be determined after lab review.    Chest xray order for cough and hx of smoking.  I put in ct chest screening due to history of smoking. May not qualify but due to pt concern did place.  Preventive Care 50-50 Years Old, Male Preventive care refers to lifestyle choices and visits with your health care provider that can promote health and wellness. This includes: A yearly physical exam. This is also called an annual wellness visit. Regular dental and eye exams. Immunizations. Screening for certain conditions. Healthy lifestyle choices, such as: Eating a healthy diet. Getting regular exercise. Not using drugs or products that contain nicotine and tobacco. Limiting alcohol use. What can I expect for my preventive care visit? Physical exam Your health care provider will check your: Height and weight. These may be used to calculate your BMI (body mass index). BMI is a measurement that tells if you are at a healthy weight. Heart rate and blood pressure. Body temperature. Skin for abnormal spots. Counseling Your health care provider may ask you questions about your: Past medical problems. Family's medical history. Alcohol, tobacco, and drug use. Emotional well-being. Home life and relationship well-being. Sexual activity. Diet, exercise, and sleep habits. Work and work Statistician. Access to firearms. What immunizations do I need?  Vaccines are usually given at various ages, according to a schedule. Your health care provider will recommend vaccines for you based on your age, medicalhistory, and lifestyle or other factors, such as travel or where you work. What tests do I need? Blood tests Lipid and cholesterol levels. These may be checked every 5 years, or  more often if you are over 61 years old. Hepatitis C test. Hepatitis B test. Screening Lung cancer screening. You may have this screening every year starting at age 40 if you have a 30-pack-year history of smoking and currently smoke or have quit within the past 15 years. Prostate cancer screening. Recommendations will vary depending on your family history and other risks. Genital exam to check for testicular cancer or hernias. Colorectal cancer screening. All adults should have this screening starting at age 82 and continuing until age 50. Your health care provider may recommend screening at age 56 if you are at increased risk. You will have tests every 1-10 years, depending on your results and the type of screening test. Diabetes screening. This is done by checking your blood sugar (glucose) after you have not eaten for a while (fasting). You may have this done every 1-3 years. STD (sexually transmitted disease) testing, if you are at risk. Follow these instructions at home: Eating and drinking  Eat a diet that includes fresh fruits and vegetables, whole grains, lean protein, and low-fat dairy products. Take vitamin and mineral supplements as recommended by your health care provider. Do not drink alcohol if your health care provider tells you not to drink. If you drink alcohol: Limit how much you have to 0-2 drinks a day. Be aware of how much alcohol is in your drink. In the [place] one drink equals one 12 oz bottle of beer (355 mL), one 5 oz glass of wine (148 mL), or one 1 oz glass of hard liquor (44 mL).  Lifestyle Take daily care of your teeth and gums. Brush your  teeth every morning and night with fluoride toothpaste. Floss one time each day. Stay active. Exercise for at least 30 minutes 5 or more days each week. Do not use any products that contain nicotine or tobacco, such as cigarettes, e-cigarettes, and chewing tobacco. If you need help quitting, ask your health care  provider. Do not use drugs. If you are sexually active, practice safe sex. Use a condom or other form of protection to prevent STIs (sexually transmitted infections). If told by your health care provider, take low-dose aspirin daily starting at age 73. Find healthy ways to cope with stress, such as: Meditation, yoga, or listening to music. Journaling. Talking to a trusted person. Spending time with friends and family. Safety Always wear your seat belt while driving or riding in a vehicle. Do not drive: If you have been drinking alcohol. Do not ride with someone who has been drinking. When you are tired or distracted. While texting. Wear a helmet and other protective equipment during sports activities. If you have firearms in your house, make sure you follow all gun safety procedures. What's next? Go to your health care provider once a year for an annual wellness visit. Ask your health care provider how often you should have your eyes and teeth checked. Stay up to date on all vaccines. This information is not intended to replace advice given to you by your health care provider. Make sure you discuss any questions you have with your healthcare provider. Document Revised: 03/31/2019 Document Reviewed: 06/26/2018 Elsevier Patient Education  2022 [employer].

## 2020-12-30 ENCOUNTER — Ambulatory Visit (HOSPITAL_BASED_OUTPATIENT_CLINIC_OR_DEPARTMENT_OTHER)
Admission: RE | Admit: 2020-12-30 | Discharge: 2020-12-30 | Disposition: A | Discharge status: Home / Self Care | Payer: 59 | Source: Ambulatory Visit | Attending: Medical | Admitting: Medical

## 2020-12-30 ENCOUNTER — Other Ambulatory Visit: Payer: Self-pay

## 2020-12-30 DIAGNOSIS — F17200 Nicotine dependence, unspecified, uncomplicated: Secondary | ICD-10-CM | POA: Diagnosis present

## 2021-11-09 ENCOUNTER — Ambulatory Visit: Payer: 59 | Admitting: Medical

## 2021-11-09 VITALS — BP 111/70 | HR 89 | Temp 98.5°F | Resp 18 | Ht 67.0 in | Wt 160.4 lb

## 2021-11-09 DIAGNOSIS — E785 Hyperlipidemia, unspecified: Secondary | ICD-10-CM | POA: Diagnosis not present

## 2021-11-09 DIAGNOSIS — F17200 Nicotine dependence, unspecified, uncomplicated: Secondary | ICD-10-CM | POA: Diagnosis not present

## 2021-11-09 DIAGNOSIS — Z1211 Encounter for screening for malignant neoplasm of colon: Secondary | ICD-10-CM | POA: Diagnosis not present

## 2021-11-09 DIAGNOSIS — R739 Hyperglycemia, unspecified: Secondary | ICD-10-CM | POA: Diagnosis not present

## 2021-11-09 LAB — COMPREHENSIVE METABOLIC PANEL
ALT: 31 U/L (ref 0–53)
AST: 18 U/L (ref 0–37)
Albumin: 4.7 g/dL (ref 3.5–5.2)
Alkaline Phosphatase: 63 U/L (ref 39–117)
BUN: 13 mg/dL (ref 6–23)
CO2: 28 mEq/L (ref 19–32)
Calcium: 9.5 mg/dL (ref 8.4–10.5)
Chloride: 104 mEq/L (ref 96–112)
Creatinine, Ser: 0.98 mg/dL (ref 0.40–1.50)
GFR: 89.94 mL/min (ref >60.00)
Glucose, Bld: 80 mg/dL (ref 70–99)
Potassium: 4.7 mEq/L (ref 3.5–5.1)
Sodium: 138 mEq/L (ref 135–145)
Total Bilirubin: 0.6 mg/dL (ref 0.2–1.2)
Total Protein: 7.3 g/dL (ref 6.0–8.3)

## 2021-11-09 LAB — LIPID PANEL
Cholesterol: 208 mg/dL — ABNORMAL HIGH (ref 0–200)
HDL: 46.9 mg/dL (ref >39.00)
LDL Cholesterol: 141 mg/dL — ABNORMAL HIGH (ref 0–99)
NonHDL: 161.22
Total CHOL/HDL Ratio: 4
Triglycerides: 99 mg/dL (ref 0.0–149.0)
VLDL: 19.8 mg/dL (ref 0.0–40.0)

## 2021-11-09 LAB — HEMOGLOBIN A1C: Hgb A1c MFr Bld: 5.7 % (ref 4.6–6.5)

## 2021-11-09 NOTE — Progress Notes (Signed)
? ?Subjective:  ? ? Patient ID: Eugene Kennedy, male    DOB: [birth year], 51 y.o.   MRN: [id number] ? ?HPI ?Pt in to be referred to GI MD. He states he needs as his insurance would not cover procedure before then.  ? ?Pt states he is about to move to [place]. He will move soon by mid to early June. ? ?Pt is still smoking. He is smoking 5 cigarettes a day. He does want to quit eventually.  Smoking cessation discussed in past. Offered wellbutrin but he declined. ? ?Mild high cholesterol in past. The 10-year ASCVD risk score (Arnett DK, et al., 2019) is: 7.4% ?  Values used to calculate the score: ?    Age: 22 years ?    Sex: Male ?    Is Non-Hispanic African American: Yes ?    Diabetic: No ?    Tobacco smoker: Yes ?    Systolic Blood Pressure: 875 mmHg ?    Is BP treated: No ?    HDL Cholesterol: 43.2 mg/dL ?    Total Cholesterol: 207 mg/dL  ? ? ?Mild elevated sugar in the past. ? ? ? ?Review of Systems  ?Constitutional:  Negative for chills, diaphoresis and fever.  ?Respiratory:  Negative for cough, chest tightness, shortness of breath and wheezing.   ?Cardiovascular:  Negative for chest pain and palpitations.  ?Gastrointestinal:  Negative for abdominal pain.  ?Genitourinary:  Negative for dysuria, flank pain and frequency.  ?Musculoskeletal:  Negative for back pain, joint swelling and myalgias.  ?Skin:  Negative for rash.  ? ? ?Past Medical History:  ?Diagnosis Date  ? Asthma   ? childhood  ? Hyperlipidemia   ? Smoker   ? ?  ?Social History  ? ?Socioeconomic History  ? Marital status: Single  ?  Spouse name: Not on file  ? Number of children: Not on file  ? Years of education: Not on file  ? Highest education level: Not on file  ?Occupational History  ? Not on file  ?Tobacco Use  ? Smoking status: Every Day  ?  Packs/day: 0.50  ?  Types: Cigarettes  ?  Start date: 07/16/1990  ? Smokeless tobacco: Never  ?Substance and Sexual Activity  ? Alcohol use: Yes  ?  Comment: 5 drinks a week  ? Drug use: No  ? Sexual activity:  Yes  ?Other Topics Concern  ? Not on file  ?Social History Narrative  ? Married 02/01/2018.   ? From prior relationship- 1 daughter age 82, lives in [place]- lives in 2017.   ? Engaged July 20th, 2019 - will be married.   ? Long term GF since 2010  ?   ? 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   ? 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   ?   ? Hobbies: music, DJ- weddings, birthdays, bars  ? ?Social Determinants of Health  ? ?Financial Resource Strain: Not on file  ?Food Insecurity: Not on file  ?Transportation Needs: Not on file  ?Physical Activity: Not on file  ?Stress: Not on file  ?Social Connections: Not on file  ?Intimate Partner Violence: Not on file  ? ? ?Past Surgical History:  ?Procedure Laterality Date  ? WISDOM TOOTH EXTRACTION    ? x1  ? ? ?Family History  ?Problem Relation Age of Onset  ? Prostate cancer Father   ?     age 48. now 1 as  of 2017  ? Hypertension Father   ? Alcohol abuse Father   ?     did other drugs as well  ? Healthy Mother   ? HIV Brother   ?     died of aids- half brother. 2 half sisters.   ? Healthy Daughter   ?     65 years old  ? ? ?Allergies  ?Allergen Reactions  ? Penicillins   ? ? ?No current outpatient medications on file prior to visit.  ? ?No current facility-administered medications on file prior to visit.  ? ? ?BP 111/70   Pulse 89   Temp 98.5 ?F (36.9 ?C)   Resp 18   Ht '5\' 7"'$  (1.702 m)   Wt 160 lb 6.4 oz (72.8 kg)   SpO2 100%   BMI 25.12 kg/m?  ?  ?   ?Objective:  ? Physical Exam ? ?General ?Mental Status- Alert. General Appearance- Not in acute distress.  ? ?Skin ?General: Color- Normal Color. Moisture- Normal Moisture. ? ?Neck ?Carotid Arteries- Normal color. Moisture- Normal Moisture. No carotid bruits. No JVD. ? ?Chest and Lung Exam ?Auscultation: ?Breath Sounds:-Normal. ? ?Cardiovascular ?Auscultation:Rythm- Regular. ?Murmurs & Other Heart Sounds:Auscultation of the heart reveals- No  Murmurs. ? ?Abdomen ?Inspection:-Inspeection Normal. ?Palpation/Percussion:Note:No mass. Palpation and Percussion of the abdomen reveal- Non Tender, Non Distended + BS, no rebound or guarding. ? ? ?Neurologic ?Cranial Nerve exam:- CN III-XII intact(No nystagmus), symmetric smile. ?Strength:- 5/5 equal and symmetric strength both upper and lower extremities.  ? ? ?   ?Assessment & Plan:  ? ?Patient Instructions  ?Placing referral to GI MD for colonoscopy. ? ?For smoking cessation discussed. As you try to quit in future consider wellbutrin as mentioned in past. ? ?For high cholesterol reviewed 10 year cardiovascular risk score. Will get fasting lipid panel and cmp today. ? ?For elevated sugar will get a1c today. ? ?Follow up as needed before you move. ? ?  ?Mackie Pai, PA-C  ?

## 2021-11-09 NOTE — Patient Instructions (Addendum)
Placing referral to GI MD for colonoscopy. ? ?For smoking cessation discussed. As you try to quit in future consider wellbutrin as mentioned in past. ? ?For high cholesterol reviewed 10 year cardiovascular risk score. Will get fasting lipid panel and cmp today. ? ?For elevated sugar will get a1c today. ? ?Follow up as needed before you move. ? ? ?

## 2021-12-08 ENCOUNTER — Ambulatory Visit (AMBULATORY_SURGERY_CENTER): Payer: Self-pay | Admitting: *Deleted

## 2021-12-08 VITALS — Ht 67.0 in | Wt 155.0 lb

## 2021-12-08 DIAGNOSIS — Z1211 Encounter for screening for malignant neoplasm of colon: Secondary | ICD-10-CM

## 2021-12-08 MED ORDER — CLENPIQ 10-3.5-12 MG-GM -GM/160ML PO SOLN: 1.0000 | ORAL | 0 refills | Status: DC

## 2021-12-08 NOTE — Progress Notes (Signed)
No egg or soy allergy known to patient  No issues known to pt with past sedation with any surgeries or procedures Patient denies ever being told they had issues or difficulty with intubation  No FH of Malignant Hyperthermia Pt is not on diet pills Pt is not on  home 02  Pt is not on blood thinners  Pt denies issues with constipation  No A fib or A flutter   PV completed over the phone. Pt verified name, DOB, address and insurance during PV today.   Pt encouraged to call with questions or issues.  If pt has My chart, procedure instructions sent via My Chart  Insurance confirmed with pt at [facility] today

## 2021-12-14 ENCOUNTER — Telehealth: Payer: Self-pay | Admitting: Gastroenterology

## 2021-12-14 DIAGNOSIS — Z1211 Encounter for screening for malignant neoplasm of colon: Secondary | ICD-10-CM

## 2021-12-14 MED ORDER — CLENPIQ 10-3.5-12 MG-GM -GM/160ML PO SOLN: 1.0000 | ORAL | 0 refills | Status: DC

## 2021-12-14 NOTE — Telephone Encounter (Signed)
Pt is not sure if [facility] has [facility] card- he will [facility] to [facility] and have [facility] it thru insurance- I also resent the [facility] with a coupon to [facility]- pt to have the pharmacy use his insurance and the coupon and see what the price will be- he will CB if still expensive   Lelan Pons PV

## 2021-12-14 NOTE — Telephone Encounter (Signed)
Inbound call from patient stating he went to pick up his prep medication from the pharmacy and it was going to cost 200 dollars. Patient is seeking advice if he can get something that is more affordable. Please advise.

## 2021-12-20 ENCOUNTER — Encounter: Payer: Self-pay | Admitting: Gastroenterology

## 2021-12-22 ENCOUNTER — Encounter: Payer: Self-pay | Admitting: Gastroenterology

## 2021-12-22 ENCOUNTER — Ambulatory Visit (AMBULATORY_SURGERY_CENTER): Payer: 59 | Admitting: Gastroenterology

## 2021-12-22 VITALS — BP 104/66 | HR 81 | Temp 97.1°F | Resp 12 | Ht 67.0 in | Wt 155.0 lb

## 2021-12-22 DIAGNOSIS — Z1211 Encounter for screening for malignant neoplasm of colon: Secondary | ICD-10-CM

## 2021-12-22 DIAGNOSIS — K635 Polyp of colon: Secondary | ICD-10-CM

## 2021-12-22 DIAGNOSIS — D125 Benign neoplasm of sigmoid colon: Secondary | ICD-10-CM

## 2021-12-22 DIAGNOSIS — D124 Benign neoplasm of descending colon: Secondary | ICD-10-CM | POA: Diagnosis not present

## 2021-12-22 MED ORDER — SODIUM CHLORIDE 0.9 % IV SOLN: 500.0000 mL | Freq: Once | INTRAVENOUS | Status: DC

## 2021-12-22 NOTE — Progress Notes (Signed)
Vitals-dt  Pt's states no medical or surgical changes since previsit or office visit.

## 2021-12-22 NOTE — Op Note (Signed)
[place] Patient Name: Eugene Kennedy Procedure Date: 12/22/2021 9:35 AM MRN: [id number] Endoscopist: Nicki Reaper E. Candis Kennedy , MD Age: 51 Referring MD:  Date of Birth: 1971-06-16 Gender: Male Account #: [id number] Procedure:                Colonoscopy Indications:              Screening for colorectal malignant neoplasm, This                            is the patient's first colonoscopy Medicines:                Monitored Anesthesia Care Procedure:                Pre-Anesthesia Assessment:                           - Prior to the procedure, a History and Physical                            was performed, and patient medications and                            allergies were reviewed. The patient's tolerance of                            previous anesthesia was also reviewed. The risks                            and benefits of the procedure and the sedation                            options and risks were discussed with the patient.                            All questions were answered, and informed consent                            was obtained. Prior Anticoagulants: The patient has                            taken no previous anticoagulant or antiplatelet                            agents. ASA Grade Assessment: II - A patient with                            mild systemic disease. After reviewing the risks                            and benefits, the patient was deemed in                            satisfactory condition to undergo the procedure.  After obtaining informed consent, the colonoscope                            was passed under direct vision. Throughout the                            procedure, the patient's blood pressure, pulse, and                            oxygen saturations were monitored continuously. The                            Colonoscope was introduced through the anus and                            advanced to the the  cecum, identified by                            appendiceal orifice and ileocecal valve. The                            colonoscopy was somewhat difficult due to a                            tortuous colon. Successful completion of the                            procedure was aided by using manual pressure. The                            patient tolerated the procedure well. The quality                            of the bowel preparation was adequate. The                            ileocecal valve, appendiceal orifice, and rectum                            were photographed. The bowel preparation used was                            Clenpiq via split dose instruction. Scope In: 9:52:21 AM Scope Out: 10:30:03 AM Scope Withdrawal Time: 0 hours 31 minutes 54 seconds  Total Procedure Duration: 0 hours 37 minutes 42 seconds  Findings:                 The perianal and digital rectal examinations were                            normal. Pertinent negatives include normal                            sphincter tone and no palpable rectal lesions.  A 10 mm polyp was found in the descending colon.                            The polyp was flat. The polyp was removed with a                            cold snare. Resection and retrieval were complete.                            Estimated blood loss was minimal.                           A 20 mm polyp was found in the sigmoid colon. The                            polyp was flat. Polypectomy was attempted,                            initially using a cold snare. Polyp resection was                            incomplete with this device. This intervention then                            required a different device and polypectomy                            technique. The polyp was removed with a hot snare.                            Polyp resection was incomplete due to the polyp                            being in a very difficult  location (tightly angled                            sigmoid). The resected tissue was retrieved.                            Estimated blood loss was minimal.                           Multiple small and large-mouthed diverticula were                            found in the transverse colon and ascending colon.                           The exam was otherwise normal throughout the                            examined colon.  Non-bleeding internal hemorrhoids were found during                            retroflexion. The hemorrhoids were Grade I                            (internal hemorrhoids that do not prolapse).                           No additional abnormalities were found on                            retroflexion. Complications:            No immediate complications. Estimated Blood Loss:     Estimated blood loss was minimal. Impression:               - One 10 mm polyp in the descending colon, removed                            with a cold snare. Resected and retrieved.                           - One 20 mm polyp in the sigmoid colon, removed                            with a hot snare. Incomplete resection. Resected                            tissue retrieved.                           - Diverticulosis in the transverse colon and in the                            ascending colon.                           - Non-bleeding internal hemorrhoids. Recommendation:           - Patient has a contact number available for                            emergencies. The signs and symptoms of potential                            delayed complications were discussed with the                            patient. Return to normal activities tomorrow.                            Written discharge instructions were provided to the                            patient.                           -  Resume previous diet.                           - Continue present medications.                            - Await pathology results.                           - Repeat colonoscopy (date not yet determined) for                            surveillance based on pathology results. Eugene Brault E. Candis Schatz, MD 12/22/2021 10:36:09 AM This report has been signed electronically.

## 2021-12-22 NOTE — Progress Notes (Signed)
[facility] History and Physical   Primary Care Physician:  Mackie Pai, PA-C   Reason for Procedure:   Colon cancer screening  Plan:    Screening colonoscopy     HPI: Eugene Kennedy is a 51 y.o. male undergoing initial average risk screening colonoscopy.  He has no family history of colon cancer and no chronic GI symptoms.    Past Medical History:  Diagnosis Date   Asthma    childhood   Hyperlipidemia    Smoker     Past Surgical History:  Procedure Laterality Date   TONSILLECTOMY     as a child   WISDOM TOOTH EXTRACTION     x1    Prior to Admission medications   Medication Sig Start Date End Date Taking? Authorizing Provider  vitamin B-12 (CYANOCOBALAMIN) 100 MCG tablet Take 100 mcg by mouth daily.   Yes [provider]  Multiple Vitamin (MULTIVITAMIN ADULT PO) Take by mouth.    [provider]    Current Outpatient Medications  Medication Sig Dispense Refill   vitamin B-12 (CYANOCOBALAMIN) 100 MCG tablet Take 100 mcg by mouth daily.     Multiple Vitamin (MULTIVITAMIN ADULT PO) Take by mouth.     No current facility-administered medications for this visit.    Allergies as of 12/22/2021 - Review Complete 12/22/2021  Allergen Reaction Noted   Penicillins  05/11/2013    Family History  Problem Relation Age of Onset   Healthy Mother    Prostate cancer Father        age 73. now 4 as of 2017   Hypertension Father    Alcohol abuse Father        did other drugs as well   HIV Brother        died of aids- half brother. 2 half sisters.    Healthy Daughter        52 years old   Colon polyps Neg Hx    Colon cancer Neg Hx    Esophageal cancer Neg Hx    Rectal cancer Neg Hx    Stomach cancer Neg Hx     Social History   Socioeconomic History   Marital status: Single    Spouse name: Not on file   Number of children: Not on file   Years of education: Not on file   Highest education level: Not on file  Occupational History    Not on file  Tobacco Use   Smoking status: Every Day    Packs/day: 0.50    Types: Cigarettes    Start date: 07/16/1990   Smokeless tobacco: Never  Vaping Use   Vaping Use: Never used  Substance and Sexual Activity   Alcohol use: Yes    Comment: 5 drinks a week   Drug use: No   Sexual activity: Yes  Other Topics Concern   Not on file  Social History Narrative   Married 02/01/2018.    From prior relationship- 1 daughter age 63, lives in [place]- lives in 2017.    Engaged July 20th, 2019 - will be married.    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       Hobbies: music, DJ- weddings, birthdays, bars   Social Determinants of Radio broadcast assistant Strain: Not on [employer] Insecurity: Not on file  Transportation Needs: Not on file  Physical Activity: Not on file  Stress: Not on file  Social Connections: Not on file  Intimate Partner Violence: Not on file    Review of Systems:  All other review of systems negative except as mentioned in the HPI.  Physical Exam: Vital signs BP 117/73 (BP Location: Right Arm, Patient Position: Sitting, Cuff Size: Normal)   Pulse 83   Temp (!) 97.1 F (36.2 C) (Temporal)   Ht '5\' 7"'$  (1.702 m)   Wt 155 lb (70.3 kg)   SpO2 99%   BMI 24.28 kg/m   General:   Alert,  Well-developed, well-nourished, pleasant and cooperative in NAD Airway:  Mallampati 1 Lungs:  Clear throughout to auscultation.   Heart:  Regular rate and rhythm; no murmurs, clicks, rubs,  or gallops. Abdomen:  Soft, nontender and nondistended. Normal bowel sounds.   Neuro/Psych:  Normal mood and affect. A and O x 3   Lyrik Dockstader E. Candis Schatz, MD [facility]

## 2021-12-22 NOTE — Progress Notes (Signed)
Called to room to assist during endoscopic procedure.  Patient ID and intended procedure confirmed with present staff. Received instructions for my participation in the procedure from the performing physician.  

## 2021-12-22 NOTE — Patient Instructions (Signed)
Resume previous diet and medications. Awaiting pathology results. Repeat Colonoscopy date to be determined based on pathology results.  YOU HAD AN ENDOSCOPIC PROCEDURE TODAY AT [facility]:   Refer to the procedure report that was given to you for any specific questions about what was found during the examination.  If the procedure report does not answer your questions, please call your gastroenterologist to clarify.  If you requested that your care partner not be given the details of your procedure findings, then the procedure report has been included in a sealed envelope for you to review at your convenience later.  YOU SHOULD EXPECT: Some feelings of bloating in the abdomen. Passage of more gas than usual.  Walking can help get rid of the air that was put into your GI tract during the procedure and reduce the bloating. If you had a lower endoscopy (such as a colonoscopy or flexible sigmoidoscopy) you may notice spotting of blood in your stool or on the toilet paper. If you underwent a bowel prep for your procedure, you may not have a normal bowel movement for a few days.  Please Note:  You might notice some irritation and congestion in your nose or some drainage.  This is from the oxygen used during your procedure.  There is no need for concern and it should clear up in a day or so.  SYMPTOMS TO REPORT IMMEDIATELY:  Following lower endoscopy (colonoscopy or flexible sigmoidoscopy):  Excessive amounts of blood in the stool  Significant tenderness or worsening of abdominal pains  Swelling of the abdomen that is new, acute  Fever of 100F or higher  For urgent or emergent issues, a gastroenterologist can be reached at any hour by calling [phone]. Do not use MyChart messaging for urgent concerns.    DIET:  We do recommend a small meal at first, but then you may proceed to your regular diet.  Drink plenty of fluids but you should avoid alcoholic beverages for 24  hours.  ACTIVITY:  You should plan to take it easy for the rest of today and you should NOT DRIVE or use heavy machinery until tomorrow (because of the sedation medicines used during the test).    FOLLOW UP: Our staff will call the number listed on your records 24-72 hours following your procedure to check on you and address any questions or concerns that you may have regarding the information given to you following your procedure. If we do not reach you, we will leave a message.  We will attempt to reach you two times.  During this call, we will ask if you have developed any symptoms of COVID 19. If you develop any symptoms (ie: fever, flu-like symptoms, shortness of breath, cough etc.) before then, please call [phone].  If you test positive for Covid 19 in the 2 weeks post procedure, please call and report this information to [place].    If any biopsies were taken you will be contacted by phone or by letter within the next 1-3 weeks.  Please call us at [phone] if you have not heard about the biopsies in 3 weeks.    SIGNATURES/CONFIDENTIALITY: You and/or your care partner have signed paperwork which will be entered into your electronic medical record.  These signatures attest to the fact that that the information above on your After Visit Summary has been reviewed and is understood.  Full responsibility of the confidentiality of this discharge information lies with you and/or your care-partner.

## 2021-12-22 NOTE — Progress Notes (Signed)
PT taken to PACU. Monitors in place. VSS. Report given to RN. 

## 2021-12-25 ENCOUNTER — Telehealth: Payer: Self-pay | Admitting: *Deleted

## 2021-12-25 NOTE — Telephone Encounter (Signed)
  Follow up Call-     12/22/2021    9:01 AM  Call back number  Post procedure Call Back phone  # [phone]  Permission to leave phone message Yes     Patient questions:  Do you have a fever, pain , or abdominal swelling? No. Pain Score  0 *  Have you tolerated food without any problems? Yes.    Have you been able to return to your normal activities? Yes.    Do you have any questions about your discharge instructions: Diet   No. Medications  No. Follow up visit  No.  Do you have questions or concerns about your Care? No.  Actions: * If pain score is 4 or above: No action needed, pain <4.

## 2021-12-27 NOTE — Progress Notes (Signed)
Mr. Bracco, Kermit Balo news: the polyp (or polyps) that I removed during your recent examination were NOT precancerous.  You should continue to follow current colorectal cancer screening guidelines with a repeat colonoscopy in 10 years.    If you develop any new rectal bleeding, abdominal pain or significant bowel habit changes, please contact me before then.

## 2021-12-29 ENCOUNTER — Encounter: Payer: PRIVATE HEALTH INSURANCE | Admitting: Medical

## 2022-01-22 NOTE — Addendum Note (Signed)
Encounter addended by: Annie Paras on: 01/22/2022 12:10 PM  Actions taken: Letter saved

## 2022-08-07 IMAGING — CT CT CHEST LUNG CANCER SCREENING LOW DOSE W/O CM
2 of 3 series · 15 of 36 positions shown, 18 images · non-contrast
Comparison: No priors.

CLINICAL DATA: 49-year-old male current smoker with 30 pack-year
history of smoking. Lung cancer screening examination.

EXAM:
CT CHEST WITHOUT CONTRAST LOW-DOSE FOR LUNG CANCER SCREENING
TECHNIQUE: Multidetector CT imaging of the chest was performed following the
standard protocol without IV contrast.

[Series 2: axial st · axial · 0.71mm/px · z∈[-311,-31]mm · 12 of 66 slices shown, 15 images]
[im 5/66  mediastinal]
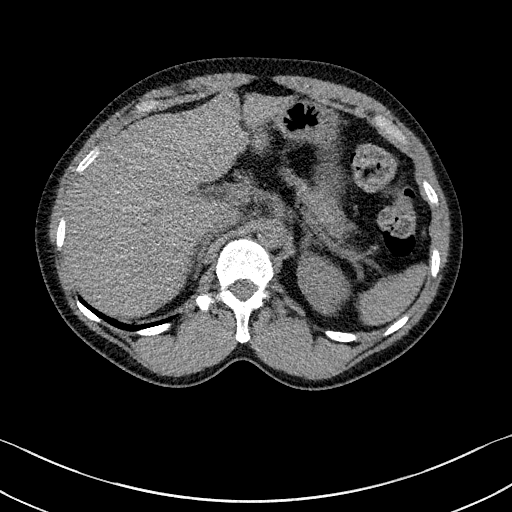
[im 5/66  lung]
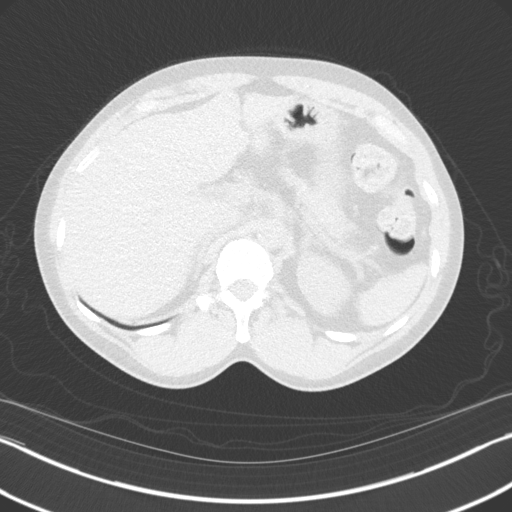
[im 10/66  lung]
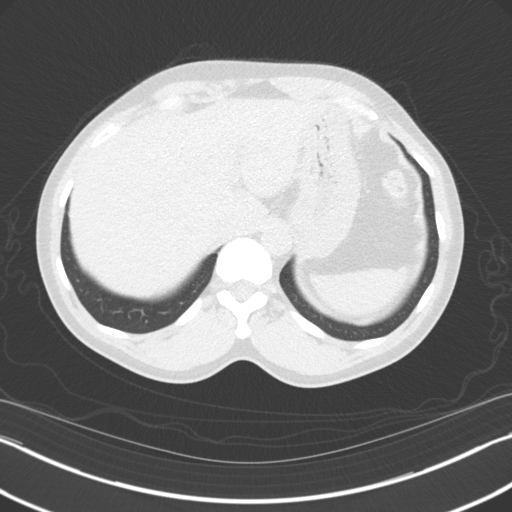
[im 15/66  lung]
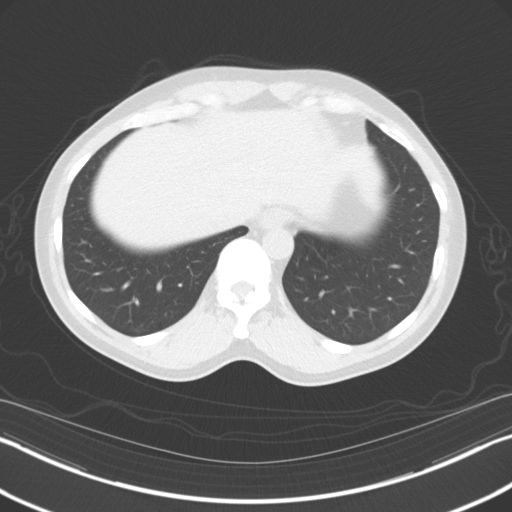
[im 20/66  lung]
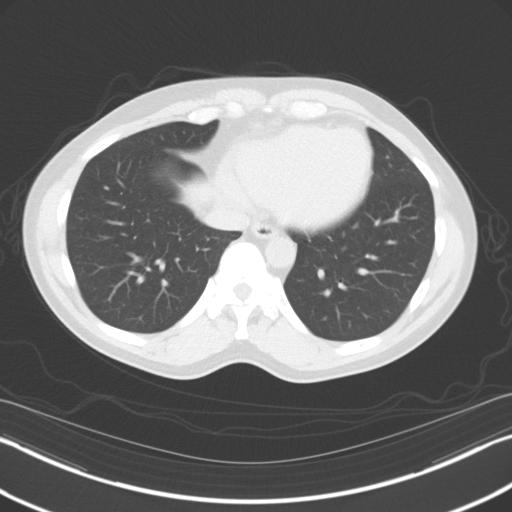
[im 25/66  mediastinal]
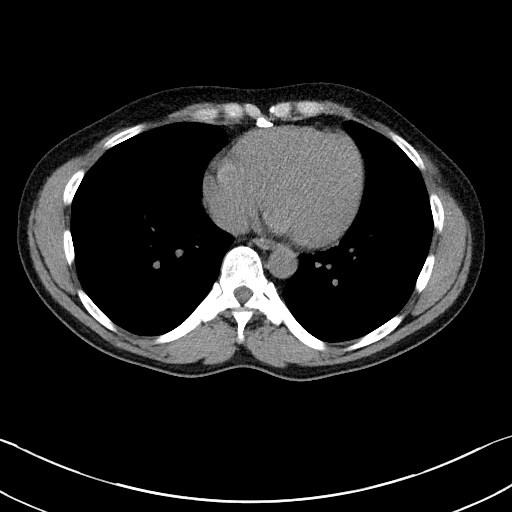
[im 25/66  lung]
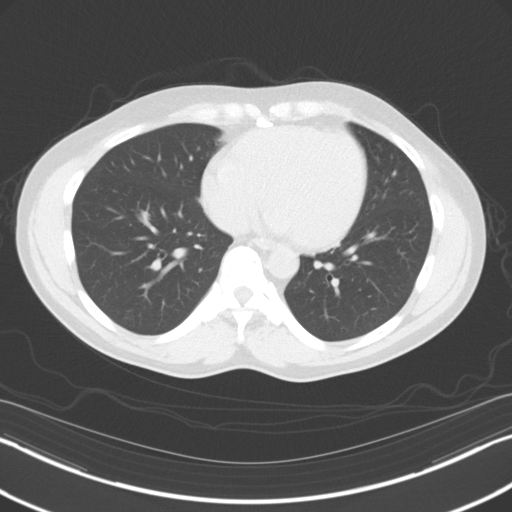
[im 29/66  lung]
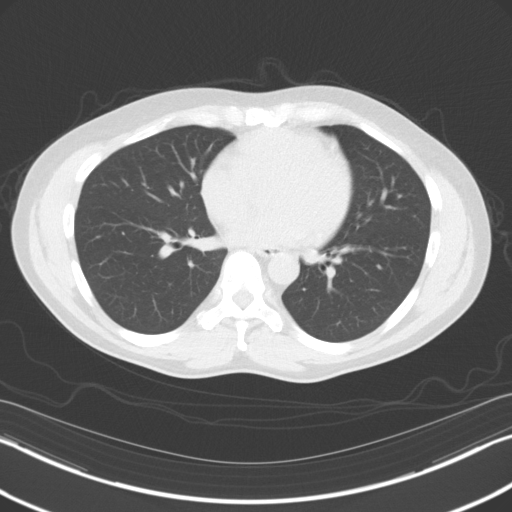
[im 37/66  lung]
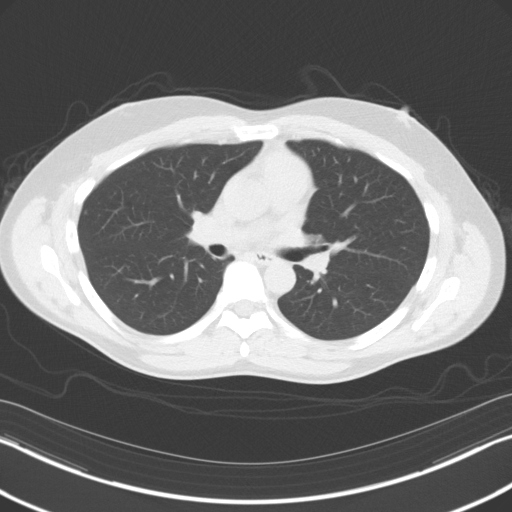
[im 41/66  lung]
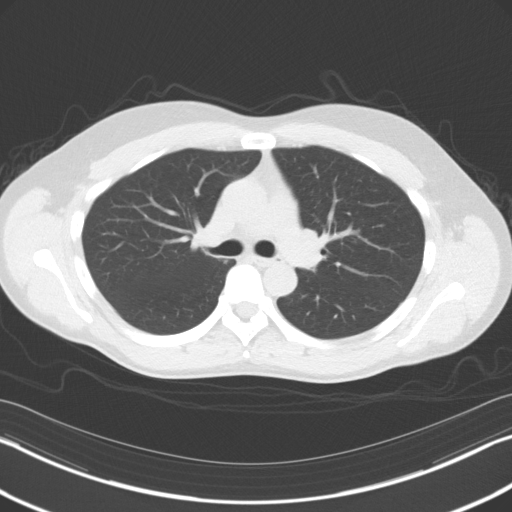
[im 46/66  mediastinal]
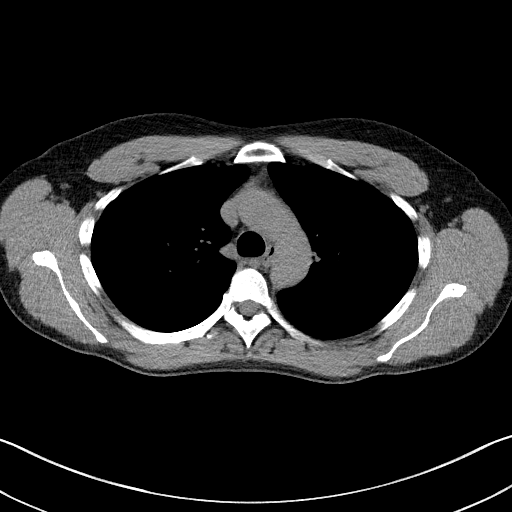
[im 46/66  lung]
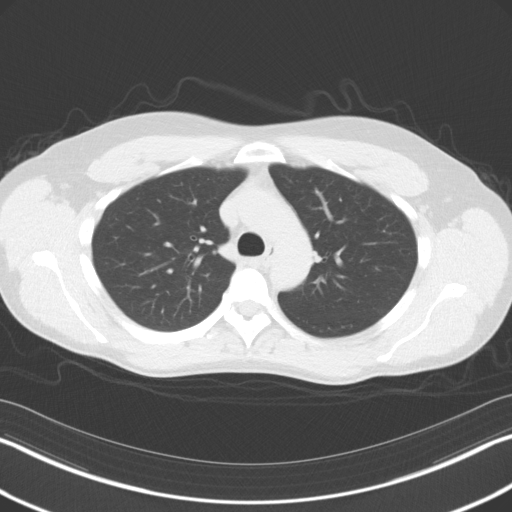
[im 51/66  lung]
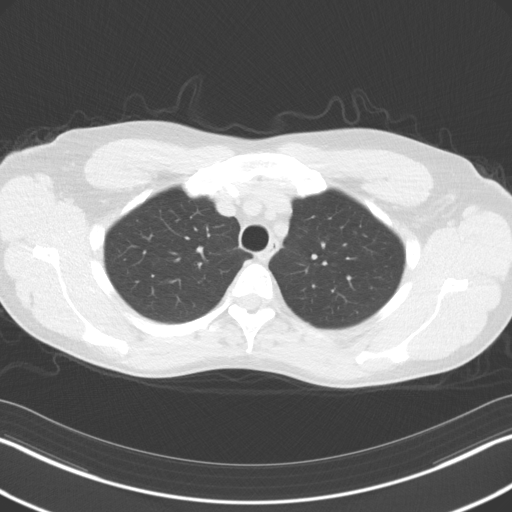
[im 56/66  lung]
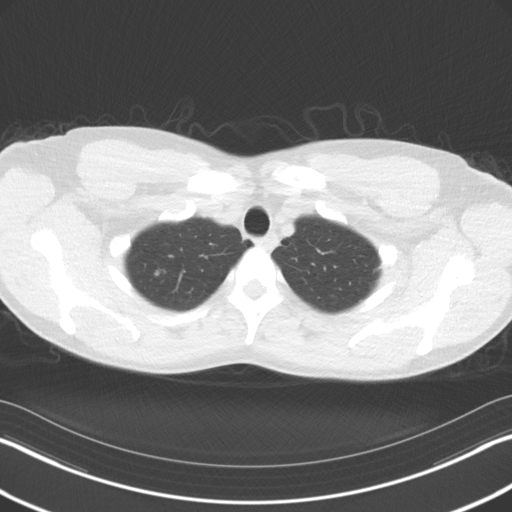
[im 61/66  lung]
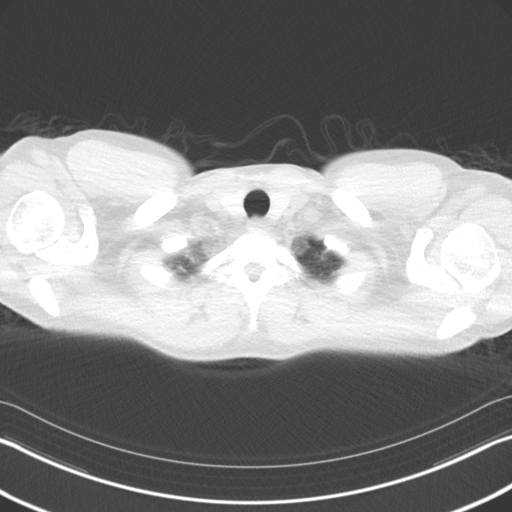

[Series 5: coronal · coronal · 0.67mm/px · 3 of 225 slices shown]
[im 45/225  lung]
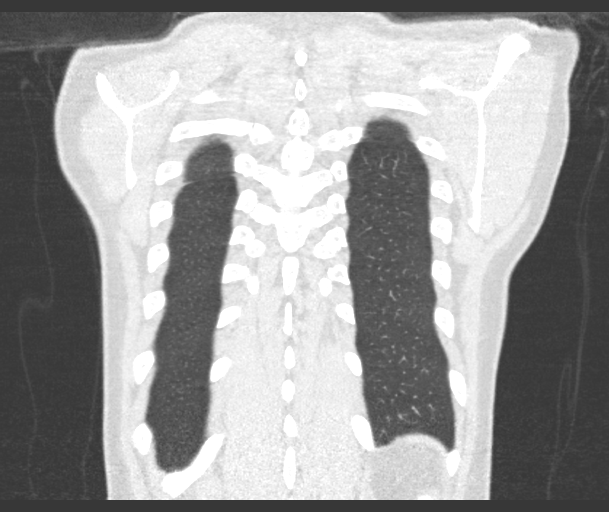
[im 90/225  lung]
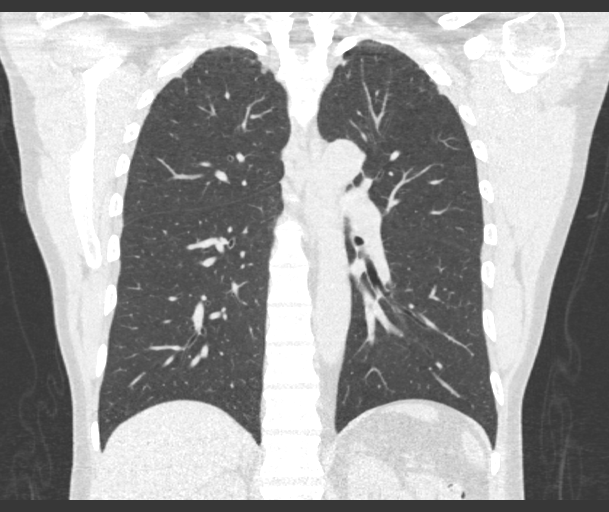
[im 135/225  lung]
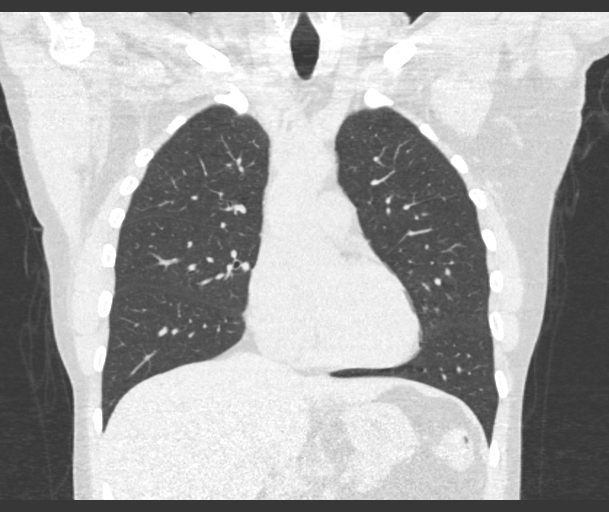

[15 of 36 positions shown; findings below may reference images not displayed]

FINDINGS: Cardiovascular: Heart size is normal. There is no significant
pericardial fluid, thickening or pericardial calcification. No
atherosclerotic calcifications in the thoracic aorta or the coronary
arteries.

Mediastinum/Nodes: No pathologically enlarged mediastinal or hilar
lymph nodes. Please note that accurate exclusion of hilar adenopathy
is limited on noncontrast CT scans. Esophagus is unremarkable in
appearance. No axillary lymphadenopathy.

Lungs/Pleura: Small pulmonary nodules scattered throughout the lungs
bilaterally, largest of which is in the right upper lobe near the
apex (axial image 36 of series 3), with a volume derived mean
diameter of 5.8 mm. No other larger more suspicious appearing
pulmonary nodules or masses are noted. No acute consolidative
airspace disease. No pleural effusions. Mild diffuse bronchial wall
thickening with mild centrilobular and paraseptal emphysema.

Upper Abdomen: Unremarkable.

Musculoskeletal: There are no aggressive appearing lytic or blastic
lesions noted in the visualized portions of the skeleton.
IMPRESSION: 1. Lung-RADS 2, benign appearance or behavior. Continue annual
screening with low-dose chest CT without contrast in 12 months.
2. Mild diffuse bronchial wall thickening with mild centrilobular
and paraseptal emphysema; imaging findings suggestive of underlying
COPD.

Emphysema ([id number]).
